# Patient Record
Sex: Male | Born: 1945 | Race: White | Hispanic: No | Marital: Married | Smoking: Current every day smoker
Health system: Southern US, Community
[De-identification: ages and names within clinical notes are randomized; demographics above are authoritative.]

## PROBLEM LIST (undated history)

## (undated) DIAGNOSIS — I25810 Atherosclerosis of coronary artery bypass graft(s) without angina pectoris: Secondary | ICD-10-CM

## (undated) DIAGNOSIS — E7800 Pure hypercholesterolemia, unspecified: Secondary | ICD-10-CM

## (undated) DIAGNOSIS — E669 Obesity, unspecified: Secondary | ICD-10-CM

## (undated) DIAGNOSIS — Z720 Tobacco use: Secondary | ICD-10-CM

## (undated) DIAGNOSIS — E079 Disorder of thyroid, unspecified: Secondary | ICD-10-CM

## (undated) DIAGNOSIS — I10 Essential (primary) hypertension: Secondary | ICD-10-CM

## (undated) DIAGNOSIS — J449 Chronic obstructive pulmonary disease, unspecified: Secondary | ICD-10-CM

## (undated) HISTORY — PX: CORONARY ARTERY BYPASS GRAFT: SHX141

---

## 2004-09-12 ENCOUNTER — Ambulatory Visit: Payer: Self-pay | Admitting: Cardiology

## 2004-09-14 ENCOUNTER — Inpatient Hospital Stay (HOSPITAL_COMMUNITY): Admission: AD | Admit: 2004-09-14 | Discharge: 2004-09-21 | Payer: Self-pay | Admitting: Cardiology

## 2004-09-15 ENCOUNTER — Ambulatory Visit: Payer: Self-pay | Admitting: Cardiology

## 2004-10-25 ENCOUNTER — Encounter
Admission: RE | Admit: 2004-10-25 | Discharge: 2004-10-25 | Payer: Self-pay | Admitting: Thoracic Surgery (Cardiothoracic Vascular Surgery)

## 2004-11-02 ENCOUNTER — Ambulatory Visit: Payer: Self-pay | Admitting: Cardiology

## 2004-11-08 ENCOUNTER — Encounter
Admission: RE | Admit: 2004-11-08 | Discharge: 2004-11-08 | Payer: Self-pay | Admitting: Thoracic Surgery (Cardiothoracic Vascular Surgery)

## 2005-03-17 ENCOUNTER — Ambulatory Visit: Payer: Self-pay | Admitting: Family Medicine

## 2005-04-03 ENCOUNTER — Ambulatory Visit: Payer: Self-pay | Admitting: Family Medicine

## 2005-06-01 ENCOUNTER — Ambulatory Visit: Payer: Self-pay | Admitting: Family Medicine

## 2005-07-20 ENCOUNTER — Ambulatory Visit: Payer: Self-pay | Admitting: Family Medicine

## 2005-10-11 ENCOUNTER — Ambulatory Visit: Payer: Self-pay | Admitting: Family Medicine

## 2012-09-19 DIAGNOSIS — Z7982 Long term (current) use of aspirin: Secondary | ICD-10-CM

## 2012-09-19 DIAGNOSIS — Z602 Problems related to living alone: Secondary | ICD-10-CM

## 2012-09-19 DIAGNOSIS — F17200 Nicotine dependence, unspecified, uncomplicated: Secondary | ICD-10-CM | POA: Diagnosis present

## 2012-09-19 DIAGNOSIS — G458 Other transient cerebral ischemic attacks and related syndromes: Principal | ICD-10-CM | POA: Diagnosis present

## 2012-09-19 DIAGNOSIS — I10 Essential (primary) hypertension: Secondary | ICD-10-CM | POA: Diagnosis present

## 2012-09-19 DIAGNOSIS — R29898 Other symptoms and signs involving the musculoskeletal system: Secondary | ICD-10-CM | POA: Diagnosis present

## 2012-09-19 DIAGNOSIS — I2510 Atherosclerotic heart disease of native coronary artery without angina pectoris: Secondary | ICD-10-CM | POA: Diagnosis present

## 2012-09-19 DIAGNOSIS — Z951 Presence of aortocoronary bypass graft: Secondary | ICD-10-CM

## 2012-09-19 DIAGNOSIS — Z79899 Other long term (current) drug therapy: Secondary | ICD-10-CM

## 2012-09-19 DIAGNOSIS — I440 Atrioventricular block, first degree: Secondary | ICD-10-CM | POA: Diagnosis present

## 2012-09-19 MED ORDER — ONDANSETRON HCL 4 MG/2ML IJ SOLN
4.0000 mg | Freq: Once | INTRAMUSCULAR | Status: AC
  Administered 2012-09-20: 4 mg via INTRAVENOUS
  Filled 2012-09-19: qty 2

## 2012-09-19 MED ORDER — FENTANYL CITRATE 0.05 MG/ML IJ SOLN
50.0000 ug | Freq: Once | INTRAMUSCULAR | Status: AC
  Administered 2012-09-20: 50 ug via INTRAVENOUS
  Filled 2012-09-19: qty 2

## 2012-09-19 MED ORDER — LABETALOL HCL 5 MG/ML IV SOLN: 20.0000 mg | Freq: Once | INTRAVENOUS | Status: DC

## 2012-09-19 NOTE — ED Notes (Signed)
Per EMS: Pt from home c/o HTN and HA. Pt states symptoms have been present for several days. States he has been prescribed medication for HTN but has not taken these medication in about 4 years. Last V/S: BP205/76 Pulse 80. Stroke screen negative. Alertx4, NAD.

## 2012-09-19 NOTE — ED Provider Notes (Signed)
History    CSN: [id number] Arrival date & time 09/19/12  2339  First MD Initiated Contact with Patient 09/19/12 2356     Chief Complaint  Patient presents with  . Headache   (Consider location/radiation/quality/duration/timing/severity/associated sxs/prior Treatment) HPI HX per PT  - HA for the last few days, gradual onset, bitemporal throbbing, worse tonight, called EMS and found to be hypertensive.  He admits to noncompliance with HTN medications for some time now. He has some associated R arm tingling since yesterday, he denies any weakness, no trouble with gait or speech or vision.  HA moderate to severe. No neck pain, rash or fevers.   No past medical history on file. No past surgical history on file. No family history on file. History  Substance Use Topics  . Smoking status: Not on file  . Smokeless tobacco: Not on file  . Alcohol Use: Not on file    Review of Systems  Constitutional: Negative for fever and chills.  HENT: Negative for neck pain and neck stiffness.   Eyes: Negative for pain.  Respiratory: Negative for shortness of breath.   Cardiovascular: Negative for chest pain.  Gastrointestinal: Negative for abdominal pain.  Genitourinary: Negative for dysuria.  Musculoskeletal: Negative for back pain.  Skin: Negative for rash.  Neurological: Positive for numbness and headaches.  All other systems reviewed and are negative.    Allergies  Review of patient's allergies indicates no known allergies.  Home Medications  No current outpatient prescriptions on file. BP 215/98  Pulse 78  Temp(Src) 98.1 F (36.7 C) (Oral)  Resp 20  SpO2 98% Physical Exam  Constitutional: He is oriented to person, place, and time. He appears well-developed and well-nourished.  HENT:  Head: Normocephalic and atraumatic.  Eyes: Conjunctivae and EOM are normal. Pupils are equal, round, and reactive to light.  Neck: Full passive range of motion without pain. Neck supple. No  thyromegaly present.  No meningismus  Cardiovascular: Normal rate, regular rhythm, S1 normal, S2 normal and intact distal pulses.   Pulmonary/Chest: Effort normal and breath sounds normal.  Abdominal: Soft. Bowel sounds are normal. There is no tenderness. There is no CVA tenderness.  Musculoskeletal: Normal range of motion. He exhibits no edema and no tenderness.  Neurological: He is alert and oriented to person, place, and time. He has normal strength and normal reflexes. No cranial nerve deficit or sensory deficit. He displays a negative Romberg sign. Coordination normal. GCS eye subscore is 4. GCS verbal subscore is 5. GCS motor subscore is 6.  Sub dec sensorium to light touch RUE, no motor deficits with equal grips/ biceps/ triceps and dorsi/ plantar flexion  Skin: Skin is warm and dry. No rash noted. No cyanosis. Nails show no clubbing.  Psychiatric: He has a normal mood and affect. His speech is normal and behavior is normal.    ED Course  Procedures (including critical care time)  Results for orders placed during the hospital encounter of 09/20/12  TROPONIN I      Result Value Range   Troponin I <0.30  <0.30 ng/mL  CBC      Result Value Range   WBC 7.0  4.0 - 10.5 K/uL   RBC 5.16  4.22 - 5.81 MIL/uL   Hemoglobin 14.9  13.0 - 17.0 g/dL   HCT 78.2  95.6 - 21.3 %   MCV 83.1  78.0 - 100.0 fL   MCH 28.9  26.0 - 34.0 pg   MCHC 34.7  30.0 - 36.0  g/dL   RDW 16.1  09.6 - 04.5 %   Platelets 105 (*) 150 - 400 K/uL  COMPREHENSIVE METABOLIC PANEL      Result Value Range   Sodium 138  135 - 145 mEq/L   Potassium 3.7  3.5 - 5.1 mEq/L   Chloride 103  96 - 112 mEq/L   CO2 25  19 - 32 mEq/L   Glucose, Bld 129 (*) 70 - 99 mg/dL   BUN 10  6 - 23 mg/dL   Creatinine, Ser 4.09  0.50 - 1.35 mg/dL   Calcium 8.5  8.4 - 81.1 mg/dL   Total Protein 6.7  6.0 - 8.3 g/dL   Albumin 3.5  3.5 - 5.2 g/dL   AST 51 (*) 0 - 37 U/L   ALT 47  0 - 53 U/L   Alkaline Phosphatase 90  39 - 117 U/L   Total  Bilirubin 0.4  0.3 - 1.2 mg/dL   GFR calc non Af Amer 82 (*) >90 mL/min   GFR calc Af Amer >90  >90 mL/min  POCT I-STAT, CHEM 8      Result Value Range   Sodium 141  135 - 145 mEq/L   Potassium 3.6  3.5 - 5.1 mEq/L   Chloride 103  96 - 112 mEq/L   BUN 9  6 - 23 mg/dL   Creatinine, Ser 9.14  0.50 - 1.35 mg/dL   Glucose, Bld 782 (*) 70 - 99 mg/dL   Calcium, Ion 9.56 (*) 1.13 - 1.30 mmol/L   TCO2 24  0 - 100 mmol/L   Hemoglobin 15.3  13.0 - 17.0 g/dL   HCT 21.3  08.6 - 57.8 %   Ct Head Wo Contrast  09/20/2012   *RADIOLOGY REPORT*  Clinical Data: Headaches.  Elevated blood pressure.  Symptoms for several days.  CT HEAD WITHOUT CONTRAST  Technique:  Contiguous axial images were obtained from the base of the skull through the vertex without contrast.  Comparison: None.  Findings: Mild cerebral atrophy.  No ventricular dilatation.  Low attenuation changes in the deep white matter consistent with central small vessel ischemia.  No mass effect or midline shift. No abnormal extra-axial fluid collections.  Gray-white matter junctions are distinct.  Basal cisterns are not effaced.  No evidence of acute intracranial hemorrhage.  No depressed skull fractures.  Visualized paranasal sinuses and mastoid air cells are not opacified.  Probable old fracture deformity of the lateral right maxillary antral wall.  Vascular calcifications.  IMPRESSION: No acute intracranial abnormalities.   Original Report Authenticated By: Burman Nieves, M.D.       Date: 09/19/2012  Rate: 72  Rhythm: normal sinus rhythm  QRS Axis: left  Intervals: PR prolonged  ST/T Wave abnormalities: nonspecific ST/T changes and ST depressions laterally  Conduction Disutrbances:first-degree A-V block   Narrative Interpretation:   Old EKG Reviewed: none available  Recheck after pain medications, headache improving with right upper extremity numbness resolved  2:05 AM d/w MED teaching service - will admit TIA work up  MDM  Headache  with severe hypertension and right arm numbness  Evaluated with EKG, labs and imaging reviewed as above.  IV narcotics, held labetalol for improving blood pressure and concern for TIA  Medical admission    Sunnie Nielsen, MD 09/21/12 [phone]

## 2012-09-20 ENCOUNTER — Emergency Department (HOSPITAL_COMMUNITY): Payer: Medicare Other

## 2012-09-20 ENCOUNTER — Inpatient Hospital Stay (HOSPITAL_COMMUNITY)
Admission: EM | Admit: 2012-09-20 | Discharge: 2012-09-21 | DRG: 069 | Disposition: A | Discharge status: Home / Self Care | Payer: Medicare Other | Attending: Internal Medicine | Admitting: Internal Medicine

## 2012-09-20 ENCOUNTER — Encounter (HOSPITAL_COMMUNITY): Payer: Self-pay | Admitting: Radiology

## 2012-09-20 ENCOUNTER — Inpatient Hospital Stay (HOSPITAL_COMMUNITY): Payer: Medicare Other

## 2012-09-20 DIAGNOSIS — R42 Dizziness and giddiness: Secondary | ICD-10-CM

## 2012-09-20 DIAGNOSIS — M6281 Muscle weakness (generalized): Secondary | ICD-10-CM

## 2012-09-20 DIAGNOSIS — I160 Hypertensive urgency: Secondary | ICD-10-CM

## 2012-09-20 DIAGNOSIS — I10 Essential (primary) hypertension: Secondary | ICD-10-CM | POA: Diagnosis present

## 2012-09-20 DIAGNOSIS — I517 Cardiomegaly: Secondary | ICD-10-CM

## 2012-09-20 DIAGNOSIS — G459 Transient cerebral ischemic attack, unspecified: Secondary | ICD-10-CM | POA: Diagnosis present

## 2012-09-20 DIAGNOSIS — R531 Weakness: Secondary | ICD-10-CM

## 2012-09-20 DIAGNOSIS — Z951 Presence of aortocoronary bypass graft: Secondary | ICD-10-CM

## 2012-09-20 HISTORY — DX: Atherosclerosis of coronary artery bypass graft(s) without angina pectoris: I25.810

## 2012-09-20 HISTORY — DX: Essential (primary) hypertension: I10

## 2012-09-20 LAB — CBC
HCT: 42.9 % (ref 39.0–52.0)
HCT: 44.3 % (ref 39.0–52.0)
Hemoglobin: 14.9 g/dL (ref 13.0–17.0)
Hemoglobin: 15.2 g/dL (ref 13.0–17.0)
MCH: 28.6 pg (ref 26.0–34.0)
MCHC: 34.3 g/dL (ref 30.0–36.0)
MCV: 83.1 fL (ref 78.0–100.0)
MCV: 83.4 fL (ref 78.0–100.0)
RBC: 5.31 MIL/uL (ref 4.22–5.81)
RDW: 14.3 % (ref 11.5–15.5)
WBC: 7 10*3/uL (ref 4.0–10.5)

## 2012-09-20 LAB — COMPREHENSIVE METABOLIC PANEL
ALT: 47 U/L (ref 0–53)
Albumin: 3.5 g/dL (ref 3.5–5.2)
Alkaline Phosphatase: 90 U/L (ref 39–117)
CO2: 25 mEq/L (ref 19–32)
Creatinine, Ser: 0.97 mg/dL (ref 0.50–1.35)
GFR calc Af Amer: >90 mL/min (ref >90)
GFR calc non Af Amer: 82 mL/min — ABNORMAL LOW (ref >90)
Glucose, Bld: 129 mg/dL — ABNORMAL HIGH (ref 70–99)
Potassium: 3.7 mEq/L (ref 3.5–5.1)
Total Bilirubin: 0.4 mg/dL (ref 0.3–1.2)
Total Protein: 6.7 g/dL (ref 6.0–8.3)

## 2012-09-20 LAB — URINALYSIS, ROUTINE W REFLEX MICROSCOPIC
Glucose, UA: NEGATIVE mg/dL
Ketones, ur: NEGATIVE mg/dL
Leukocytes, UA: NEGATIVE
Protein, ur: NEGATIVE mg/dL

## 2012-09-20 LAB — TROPONIN I: Troponin I: <0.3 ng/mL (ref <0.30)

## 2012-09-20 LAB — POCT I-STAT, CHEM 8
Calcium, Ion: 1.09 mmol/L — ABNORMAL LOW (ref 1.13–1.30)
Creatinine, Ser: 1 mg/dL (ref 0.50–1.35)
HCT: 45 % (ref 39.0–52.0)

## 2012-09-20 MED ORDER — ASPIRIN 325 MG PO TABS
325.0000 mg | ORAL_TABLET | Freq: Every day | ORAL | Status: DC
  Administered 2012-09-20 – 2012-09-21 (×2): 325 mg via ORAL
  Filled 2012-09-20: qty 1

## 2012-09-20 MED ORDER — HEPARIN SODIUM (PORCINE) 5000 UNIT/ML IJ SOLN
5000.0000 [IU] | Freq: Three times a day (TID) | INTRAMUSCULAR | Status: DC
  Administered 2012-09-20 – 2012-09-21 (×3): 5000 [IU] via SUBCUTANEOUS
  Filled 2012-09-20 (×7): qty 1

## 2012-09-20 MED ORDER — SENNOSIDES-DOCUSATE SODIUM 8.6-50 MG PO TABS: 1.0000 | ORAL_TABLET | Freq: Every evening | ORAL | Status: DC | PRN

## 2012-09-20 MED ORDER — ACETAMINOPHEN 325 MG PO TABS
650.0000 mg | ORAL_TABLET | Freq: Four times a day (QID) | ORAL | Status: DC | PRN
  Administered 2012-09-20: 650 mg via ORAL
  Filled 2012-09-20: qty 2

## 2012-09-20 MED ORDER — ASPIRIN 300 MG RE SUPP
300.0000 mg | Freq: Every day | RECTAL | Status: DC
  Filled 2012-09-20 (×2): qty 1

## 2012-09-20 NOTE — ED Notes (Signed)
Report given to floor nurse and pt transported to the floor with tech and stretcher. Pt alert and in NAD at time of admission

## 2012-09-20 NOTE — ED Notes (Signed)
Pt taken to ct with tech

## 2012-09-20 NOTE — H&P (Signed)
I saw and evaluated the patient. I reviewed the resident's note and confirmed the resident's findings.  I agree with the assessment and plan as documented in the resident's note.  Briefly,  Randy Carpenter is a 67 yo man with a history of CAD s/p CABG, HTN, and tobacco abuse who presents with 2 days of intermittent right sided weakness and dizziness.He called EMS when the symptoms persisted and was found to have systolic blood pressures > 200.  He was brought to the ED and admitted to the Internal Medicine Teaching Service to rule out stroke. Since admission his dizziness has resolved and he has had no more intermittent right sided weakness.  His strength is 5/5 bilaterally throughout.  MRI/MRA was only notable for small vessel disease with no acute CVA.  Carotid dopplers failed to demonstrate significant carotid stenosis.  The Echo is pending.  The cause of his symptoms remains unclear, although the hypertension could have contributed to the dizziness.  We will reinitiate antihypertensive therapy with medications on the $4 list.  If he does well on telemetry overnight I anticipate he will be ready for discharge home in the AM.  He will need to reestablish with his PCP for follow-up.

## 2012-09-20 NOTE — Progress Notes (Signed)
UR COMPLETED Patient has Medicare and Medicaid with prescription drug coverage

## 2012-09-20 NOTE — H&P (Signed)
Date: 09/20/2012               Patient Name:  Randy Carpenter MRN: [id number]  DOB: 1945/07/05 Age / Sex: 67 y.o., male   PCP: No primary provider on file.         Medical Service: Internal Medicine Teaching Service         Attending Physician: Dr. Sunnie Nielsen, MD    First Contact: Dr. Rocco Serene Pager: [phone]  Second Contact: Dr. Janalyn Harder Pager: [phone]       After Hours (After 5p/  First Contact Pager: [phone]  weekends / holidays): Second Contact Pager: [phone]   Chief Complaint: dizziness and right-sided weakness x 2 days  History of Present Illness: Randy Carpenter is a 67 year old male with a PMH of HTN, CAD s/p CABG (6 years ago) presenting for a 2 day history of dizziness and right-sided weakness x 2 days.  He says he experienced dizziness and difficulty walking (as if his "right leg and arm didn't want to work")  on the night of July 9 but felt better when he woke up on July 10.  When he took a shower that morning he began to feel dizzy again.  He felt this way all day and tried to lay down but reports feeling as if his "head was swelling up and going down" so he called EMS.  His BP was reported to be in the 200s by EMS.  Randy Carpenter has been experiencing chronic headache and R sided CP.  He denies blurry vision.  He is SOB at baseline and reports no change.  He does report 2 pillow orthopnea and has occasional lower extremity edema.  He denies sick contacts.  He lives alone in an independent living facility.  Randy Carpenter has not take his anti-hypertensive medications in 3 years and says he could not afford to go to the doctor anymore.   Meds: Current Facility-Administered Medications  Medication Dose Route Frequency Provider Last Rate Last Dose  . labetalol (NORMODYNE,TRANDATE) injection 20 mg  20 mg Intravenous Once Sunnie Nielsen, MD       No current outpatient prescriptions on file.    Allergies: Allergies as of 09/19/2012  . (No Known Allergies)   History reviewed. No  pertinent past medical history. No past surgical history on file. No family history on file. History   Social History  . Marital Status: Married    Spouse Name: N/A    Number of Children: N/A  . Years of Education: N/A   Occupational History  . Not on file.   Social History Main Topics  . Smoking status: Not on file  . Smokeless tobacco: Not on file  . Alcohol Use: Not on file  . Drug Use: Not on file  . Sexually Active: Not on file   Other Topics Concern  . Not on file   Social History Narrative  . No narrative on file    Review of Systems: Pertinent items are noted in HPI.  Physical Exam: Blood pressure 178/90, pulse 72, temperature 98.1 F (36.7 C), temperature source Oral, resp. rate 19, SpO2 96.00%. General: resting in bed in NAD HEENT: + miosis, EOMI, mucous membranes moist Cardiac: RRR, no rubs, murmurs or gallops, no carotid bruits Pulm: clear to auscultation bilaterally, moving normal volumes of air Abd: soft, nontender, nondistended, BS present Ext: no lower extremity edema  Neuro: alert and oriented X3, mentating appropriately, no facial asymmetry/dysarthria, decreased muscle strength RU and  RL extremity (4/5), LUE and LLE muscle strenght 5/5   Lab results: Basic Metabolic Panel:  Recent Labs  78/46/96 0028 09/20/12 0043  NA 138 141  K 3.7 3.6  CL 103 103  CO2 25  --   GLUCOSE 129* 119*  BUN 10 9  CREATININE 0.97 1.00  CALCIUM 8.5  --    Liver Function Tests:  Recent Labs  09/20/12 0028  AST 51*  ALT 47  ALKPHOS 90  BILITOT 0.4  PROT 6.7  ALBUMIN 3.5   No results found for this basename: LIPASE, AMYLASE,  in the last 72 hours No results found for this basename: AMMONIA,  in the last 72 hours CBC:  Recent Labs  09/20/12 0028 09/20/12 0043  WBC 7.0  --   HGB 14.9 15.3  HCT 42.9 45.0  MCV 83.1  --   PLT 105*  --    Cardiac Enzymes:  Recent Labs  09/20/12 0028  TROPONINI <0.30   BNP: No results found for this  basename: PROBNP,  in the last 72 hours D-Dimer: No results found for this basename: DDIMER,  in the last 72 hours CBG: No results found for this basename: GLUCAP,  in the last 72 hours Hemoglobin A1C: No results found for this basename: HGBA1C,  in the last 72 hours Fasting Lipid Panel: No results found for this basename: CHOL, HDL, LDLCALC, TRIG, CHOLHDL, LDLDIRECT,  in the last 72 hours Thyroid Function Tests: No results found for this basename: TSH, T4TOTAL, FREET4, T3FREE, THYROIDAB,  in the last 72 hours Anemia Panel: No results found for this basename: VITAMINB12, FOLATE, FERRITIN, TIBC, IRON, RETICCTPCT,  in the last 72 hours Coagulation: No results found for this basename: LABPROT, INR,  in the last 72 hours Urine Drug Screen: Drugs of Abuse  No results found for this basename: labopia, cocainscrnur, labbenz, amphetmu, thcu, labbarb    Alcohol Level: No results found for this basename: ETH,  in the last 72 hours Urinalysis: No results found for this basename: COLORURINE, APPERANCEUR, LABSPEC, PHURINE, GLUCOSEU, HGBUR, BILIRUBINUR, KETONESUR, PROTEINUR, UROBILINOGEN, NITRITE, LEUKOCYTESUR,  in the last 72 hours   Imaging results:  Ct Head Wo Contrast  09/20/2012   *RADIOLOGY REPORT*  Clinical Data: Headaches.  Elevated blood pressure.  Symptoms for several days.  CT HEAD WITHOUT CONTRAST  Technique:  Contiguous axial images were obtained from the base of the skull through the vertex without contrast.  Comparison: None.  Findings: Mild cerebral atrophy.  No ventricular dilatation.  Low attenuation changes in the deep white matter consistent with central small vessel ischemia.  No mass effect or midline shift. No abnormal extra-axial fluid collections.  Gray-white matter junctions are distinct.  Basal cisterns are not effaced.  No evidence of acute intracranial hemorrhage.  No depressed skull fractures.  Visualized paranasal sinuses and mastoid air cells are not opacified.  Probable  old fracture deformity of the lateral right maxillary antral wall.  Vascular calcifications.  IMPRESSION: No acute intracranial abnormalities.   Original Report Authenticated By: Burman Nieves, M.D.    Other results: EKG:  Sinus rhythm, first degree AV block, nonspecific intraventricular conduction delay, minimal ST depression lateral leads, baseline wander in leads V1.  Assessment & Plan by Problem:  Randy Carpenter is a 67 yo male with a PMH of HTN and CAD s/p CABG (6 years ago) presenting with headache and right-sided weakness x 2 days.    1. TIA - Pt with neurological deficits on exam x 2 days.  CT  negative for hemorrhage but patient outside of tPA window.  Ischemic stroke cannot be ruled out until MRI completed to evaluate for evidence of infarction.  ABCD2 score 6 (2 day stroke risk 8%, 90 day stroke risk 17.8%) - Telemetry - ASA - MRI - MRA - Echo - Carotid dopplers - AM EKG  2. Hypertension - Pt without hypertensive medications for past 3 years.  - will hold hypertensive medications for now in the setting of possible ischemic stroke   Dispo: Disposition is deferred at this time, awaiting improvement of current medical problems. Anticipated discharge in approximately 3-4 day(s).   The patient does not have a current PCP (No primary provider on file.) and does need an [facility] hospital follow-up appointment after discharge.  The patient does have transportation limitations that hinder transportation to clinic appointments.  Signed: Evelena Peat, DO 09/20/2012, 2:41 AM

## 2012-09-20 NOTE — ED Notes (Signed)
Pt resting comfortably at this time.

## 2012-09-20 NOTE — Progress Notes (Signed)
*  PRELIMINARY RESULTS* Vascular Ultrasound Carotid Duplex (Doppler) has been completed. There is no obvious evidence of hemodynamically significant internal carotid artery stenosis >40%. Vertebral arteries are patent with antegrade flow.  09/20/2012 12:46 PM Gertie Fey, RVT, RDCS, RDMS

## 2012-09-21 ENCOUNTER — Inpatient Hospital Stay (HOSPITAL_COMMUNITY): Payer: Medicare Other

## 2012-09-21 LAB — LIPID PANEL
Cholesterol: 189 mg/dL (ref 0–200)
HDL: 19 mg/dL — ABNORMAL LOW (ref >39)
Total CHOL/HDL Ratio: 9.9 RATIO
Triglycerides: 313 mg/dL — ABNORMAL HIGH (ref <150)

## 2012-09-21 LAB — HEMOGLOBIN A1C
Hgb A1c MFr Bld: 5.9 % — ABNORMAL HIGH (ref <5.7)
Mean Plasma Glucose: 123 mg/dL — ABNORMAL HIGH (ref <117)

## 2012-09-21 MED ORDER — ATORVASTATIN CALCIUM 40 MG PO TABS: 40.0000 mg | ORAL_TABLET | Freq: Every day | ORAL | Status: DC

## 2012-09-21 MED ORDER — ASPIRIN 81 MG PO TABS: 81.0000 mg | ORAL_TABLET | Freq: Every day | ORAL | Status: DC

## 2012-09-21 MED ORDER — METOPROLOL TARTRATE 50 MG PO TABS: 50.0000 mg | ORAL_TABLET | Freq: Two times a day (BID) | ORAL | Status: DC

## 2012-09-21 MED ORDER — PRAVASTATIN SODIUM 40 MG PO TABS: 40.0000 mg | ORAL_TABLET | Freq: Every day | ORAL | Status: DC

## 2012-09-21 MED ORDER — METOPROLOL TARTRATE 50 MG PO TABS
50.0000 mg | ORAL_TABLET | Freq: Two times a day (BID) | ORAL | Status: DC
  Administered 2012-09-21: 50 mg via ORAL
  Filled 2012-09-21 (×2): qty 1

## 2012-09-21 NOTE — Progress Notes (Signed)
   CARE MANAGEMENT NOTE 09/21/2012  Patient:  Randy Carpenter, Randy Carpenter   Account Number:  [id number]  Date Initiated:  09/20/2012  Documentation initiated by:  Jiles Crocker  Subjective/Objective Assessment:   ADMITTED WITH TIA     Action/Plan:   PATIENT RESIDES IN AN INDEPENDENT LIVING FACILITY- has private insurance with Medicare/ Medicaid; CM following for DCP   Anticipated DC Date:  09/27/2012   Anticipated DC Plan:  HOME/SELF CARE      DC Planning Services  CM consult      Choice offered to / List presented to:             Status of service:  Completed, signed off Medicare Important Message given?  NA - LOS <3 / Initial given by admissions (If response is "NO", the following Medicare IM given date fields will be blank) Date Medicare IM given:   Date Additional Medicare IM given:    Discharge Disposition:  HOME/SELF CARE  Per UR Regulation:  Reviewed for med. necessity/level of care/duration of stay  If discussed at Long Length of Stay Meetings, dates discussed:    Comments:  09/21/2012 1500 NCM spoke to pt and he has his Medicaid letter but no number. Faxed facesheet to [facility] with Medicaid info. NCM explained to pt to follow up with Case Worker for [employer] [employer] card. Unable to assist with medication copay or cost. Pt has drug coverage. Contacted [facility] and his pravastatin is $15 without Medicaid info. Isidoro Donning RN CCM Case Mgmt phone [phone]  09/20/2012- Abelino Derrick RN, Shari Prows

## 2012-09-21 NOTE — Progress Notes (Signed)
Occupational Therapy Evaluation and Discharge Patient Details Name: Randy Carpenter MRN: [id number] DOB: 30-May-1945 Today's Date: 09/21/2012 Time: 4098-1191 OT Time Calculation (min): 16 min  OT Assessment / Plan / Recommendation History of present illness Patient is a 67 yo male admitted with Rt sided weakness and dizziness.    Clinical Impression   PTA pt was independent with mobility and ADL. Pt performed sink level ADL and mobility at baseline. Pt does not need further OT services. OT to sign off.    OT Assessment  Patient does not need any further OT services    Follow Up Recommendations  No OT follow up       Equipment Recommendations  None recommended by OT          Precautions / Restrictions Precautions Precautions: Fall Precaution Comments: Patient reports intermittent periods of RLE weakness/buckling at home. Restrictions Weight Bearing Restrictions: No   Pertinent Vitals/Pain Pt reported no pain during session.    ADL  Eating/Feeding: Independent Where Assessed - Eating/Feeding: Chair Grooming: Wash/dry hands;Wash/dry face;Teeth care;Independent Where Assessed - Grooming: Unsupported standing Upper Body Bathing: Modified independent Where Assessed - Upper Body Bathing: Unsupported standing Lower Body Bathing: Modified independent Where Assessed - Lower Body Bathing: Unsupported sit to stand Upper Body Dressing: Modified independent Where Assessed - Upper Body Dressing: Unsupported standing Lower Body Dressing: Modified independent Where Assessed - Lower Body Dressing: Unsupported sit to stand Toilet Transfer: Modified independent Toilet Transfer Method: Sit to Barista: Comfort height toilet Toileting - Clothing Manipulation and Hygiene: Modified independent Where Assessed - Engineer, mining and Hygiene: Standing Tub/Shower Transfer: Modified independent Tub/Shower Transfer Method: Ambulating Equipment Used: Gait  belt Transfers/Ambulation Related to ADLs: Pt modified independent with all transfers and ambulation ADL Comments: Pt modified independent for sink level ADL, and eating. No deficits noted.     OT Goals(Current goals can be found in the care plan section) Acute Rehab OT Goals Patient Stated Goal: To figure out what was wrong.  Visit Information  Last OT Received On: 09/21/12 Assistance Needed: +1 History of Present Illness: Patient is a 67 yo male admitted with Rt sided weakness and dizziness.        Prior Functioning     Home Living Family/patient expects to be discharged to:: Private residence Living Arrangements: Alone Available Help at Discharge:  (None) Type of Home: Apartment Home Access: Level entry Home Layout: One level Home Equipment: None Prior Function Level of Independence: Independent Communication Communication: No difficulties Dominant Hand: Right         Vision/Perception Vision - History Baseline Vision: No visual deficits Patient Visual Report: No change from baseline Vision - Assessment Eye Alignment: Within Functional Limits   Cognition  Cognition Arousal/Alertness: Awake/alert Behavior During Therapy: WFL for tasks assessed/performed Overall Cognitive Status: Within Functional Limits for tasks assessed    Extremity/Trunk Assessment Upper Extremity Assessment Upper Extremity Assessment: Overall WFL for tasks assessed Lower Extremity Assessment Lower Extremity Assessment: Defer to PT evaluation Cervical / Trunk Assessment Cervical / Trunk Assessment: Normal     Mobility Bed Mobility Bed Mobility: Supine to Sit;Sit to Supine Supine to Sit: 7: Independent Sit to Supine: 7: Independent Transfers Transfers: Sit to Stand;Stand to Sit Sit to Stand: 6: Modified independent (Device/Increase time) Stand to Sit: 6: Modified independent (Device/Increase time) Details for Transfer Assistance: modified independent for transfers         Balance Balance Balance Assessed: Yes Standardized Balance Assessment Standardized Balance Assessment: Dynamic Gait Index Dynamic  Gait Index Level Surface: Normal Change in Gait Speed: Normal Gait with Horizontal Head Turns: Normal Gait with Vertical Head Turns: Normal Gait and Pivot Turn: Normal Step Over Obstacle: Normal Step Around Obstacles: Normal Steps: Normal Total Score: 24   End of Session OT - End of Session Equipment Utilized During Treatment: Gait belt Activity Tolerance: Patient tolerated treatment well Patient left: in chair;with call bell/phone within reach Nurse Communication: Mobility status  GO     Sherryl Manges 09/21/2012, 11:55 AM

## 2012-09-21 NOTE — Progress Notes (Signed)
I agree with the following treatment note after reviewing documentation.   Johnston, Nadean Montanaro Brynn   OTR/L Pager: [phone] Office: [phone] .   

## 2012-09-21 NOTE — Progress Notes (Signed)
Subjective: No acute events overnight.  MRI shows no evidence of stroke, and carotid dopplers and Echo unremarkable.  The patient notes no further extremity weakness or dizziness.  Of note, CXR overnight showed a well-circumscribed lesion on the lateral view, most likely artifact from an item perhaps on the patient's clothing.  Will repeat CXR PA and lateral today to confirm.  Objective: Vital signs in last 24 hours: Filed Vitals:   09/20/12 2148 09/21/12 0216 09/21/12 0547 09/21/12 1037  BP: 160/84 130/65 145/77 151/78  Pulse: 64 63 59 67  Temp: 98.3 F (36.8 C) 98 F (36.7 C) 98.1 F (36.7 C) 97.9 F (36.6 C)  TempSrc: Oral Oral Oral Oral  Resp: 18 20 20 20   Height:      Weight:      SpO2: 96% 94% 95% 95%   Weight change:   Intake/Output Summary (Last 24 hours) at 09/21/12 1142 Last data filed at 09/21/12 0600  Gross per 24 hour  Intake   1160 ml  Output    700 ml  Net    460 ml   General: alert, cooperative, and in no apparent distress HEENT: pupils equal round and reactive to light, vision grossly intact, oropharynx clear and non-erythematous  Neck: supple, no lymphadenopathy Lungs: clear to ascultation bilaterally, normal work of respiration, no wheezes, rales, ronchi Heart: regular rate and rhythm, no murmurs, gallops, or rubs Abdomen: soft, non-tender, non-distended, normal bowel sounds Extremities: no cyanosis, clubbing, or edema Neurologic: alert & oriented X3, cranial nerves II-XII intact, strength 5/5 throughout, sensation intact to light touch  Lab Results: Basic Metabolic Panel:  Recent Labs Lab 09/20/12 0028 09/20/12 0043 09/20/12 1020  NA 138 141  --   K 3.7 3.6  --   CL 103 103  --   CO2 25  --   --   GLUCOSE 129* 119*  --   BUN 10 9  --   CREATININE 0.97 1.00 1.13  CALCIUM 8.5  --   --    Liver Function Tests:  Recent Labs Lab 09/20/12 0028  AST 51*  ALT 47  ALKPHOS 90  BILITOT 0.4  PROT 6.7  ALBUMIN 3.5   CBC:  Recent  Labs Lab 09/20/12 0028 09/20/12 0043 09/20/12 1020  WBC 7.0  --  5.9  HGB 14.9 15.3 15.2  HCT 42.9 45.0 44.3  MCV 83.1  --  83.4  PLT 105*  --  105*   Cardiac Enzymes:  Recent Labs Lab 09/20/12 0028  TROPONINI <0.30   Fasting Lipid Panel:  Recent Labs Lab 09/21/12 0630  CHOL 189  HDL 19*  LDLCALC 107*  TRIG 313*  CHOLHDL 9.9   Urinalysis:  Recent Labs Lab 09/20/12 0248  COLORURINE YELLOW  LABSPEC 1.018  PHURINE 7.0  GLUCOSEU NEGATIVE  HGBUR NEGATIVE  BILIRUBINUR NEGATIVE  KETONESUR NEGATIVE  PROTEINUR NEGATIVE  UROBILINOGEN 1.0  NITRITE NEGATIVE  LEUKOCYTESUR NEGATIVE    Studies/Results: Dg Chest 2 View  09/20/2012   *RADIOLOGY REPORT*  Clinical Data: Stroke  CHEST - 2 VIEW  Comparison: 11/08/2004  Findings: COPD with pulmonary hyperinflation.  Prior CABG. Negative for heart failure or pneumonia.  Well-circumscribed oval density overlying the heart on the lateral view .  IMPRESSION: COPD.  No acute abnormality.  Ovoid density on the lateral view overlying the heart was not seen previously.  This is well circumscribed and probably benign. Follow-up chest x-ray is suggested.   Original Report Authenticated By: Janeece Riggers, M.D.   Ct Head  Wo Contrast  09/20/2012   *RADIOLOGY REPORT*  Clinical Data: Headaches.  Elevated blood pressure.  Symptoms for several days.  CT HEAD WITHOUT CONTRAST  Technique:  Contiguous axial images were obtained from the base of the skull through the vertex without contrast.  Comparison: None.  Findings: Mild cerebral atrophy.  No ventricular dilatation.  Low attenuation changes in the deep white matter consistent with central small vessel ischemia.  No mass effect or midline shift. No abnormal extra-axial fluid collections.  Gray-white matter junctions are distinct.  Basal cisterns are not effaced.  No evidence of acute intracranial hemorrhage.  No depressed skull fractures.  Visualized paranasal sinuses and mastoid air cells are not  opacified.  Probable old fracture deformity of the lateral right maxillary antral wall.  Vascular calcifications.  IMPRESSION: No acute intracranial abnormalities.   Original Report Authenticated By: Burman Nieves, M.D.   Mr Brain Wo Contrast  09/20/2012   *RADIOLOGY REPORT*  Clinical Data:  Two history of dizziness and right-sided weakness. Difficulty walking.  MRI HEAD WITHOUT CONTRAST MRA HEAD WITHOUT CONTRAST  Technique:  Multiplanar, multiecho pulse sequences of the brain and surrounding structures were obtained without intravenous contrast. Angiographic images of the head were obtained using MRA technique without contrast.  Comparison:  CT of the head without contrast 09/20/2012.  MRI HEAD  Findings:  The diffusion weighted images demonstrate no evidence for acute or subacute infarction.  Mild generalized atrophy is present.  Moderate to confluent periventricular and scattered subcortical T2 hyperintensities are evident bilaterally.  Flow is present in the major intracranial arteries.  The ventricles are proportionate to the degree of atrophy.  No significant extra-axial fluid collection is present.  The globes orbits are intact.  Mild mucosal thickening is present in the anterior ethmoid air cells and maxillary sinuses bilaterally.  No fluid levels are present.  There is some fluid in the right mastoid air cells.  No obstructing nasopharyngeal lesion is evident.  IMPRESSION:  1.  No acute or focal abnormality to explain right-sided weakness. 2.  The atrophy and moderate diffuse white matter disease.  This likely reflects the sequelae of chronic microvascular ischemia, advanced for age. 3.  Mild diffuse sinus disease.  MRA HEAD  Findings: The internal carotid arteries are within normal limits from the high cervical segments through the ICA termini.  The left A1 segment is slightly dominant to the right.  The M1 segments are within normal limits bilaterally.  The anterior communicating artery is patent.   There is segmental attenuation of distal MCA branch vessels bilaterally without significant proximal stenosis or occlusion.  The right vertebral artery is the dominant vessel.  The PICA origins are visualized and normal bilaterally.  The basilar artery is normal.  Both posterior cerebral arteries originate from the basilar tip.  There is some attenuation of distal PCA branch vessels bilaterally.  IMPRESSION:  1.  Mild distal small vessel disease. 2.  No significant proximal stenosis, aneurysm, or branch vessel occlusion.   Original Report Authenticated By: Marin Roberts, M.D.   Mr Mra Head/brain Wo Cm  09/20/2012   *RADIOLOGY REPORT*  Clinical Data:  Two history of dizziness and right-sided weakness. Difficulty walking.  MRI HEAD WITHOUT CONTRAST MRA HEAD WITHOUT CONTRAST  Technique:  Multiplanar, multiecho pulse sequences of the brain and surrounding structures were obtained without intravenous contrast. Angiographic images of the head were obtained using MRA technique without contrast.  Comparison:  CT of the head without contrast 09/20/2012.  MRI HEAD  Findings:  The diffusion weighted images demonstrate no evidence for acute or subacute infarction.  Mild generalized atrophy is present.  Moderate to confluent periventricular and scattered subcortical T2 hyperintensities are evident bilaterally.  Flow is present in the major intracranial arteries.  The ventricles are proportionate to the degree of atrophy.  No significant extra-axial fluid collection is present.  The globes orbits are intact.  Mild mucosal thickening is present in the anterior ethmoid air cells and maxillary sinuses bilaterally.  No fluid levels are present.  There is some fluid in the right mastoid air cells.  No obstructing nasopharyngeal lesion is evident.  IMPRESSION:  1.  No acute or focal abnormality to explain right-sided weakness. 2.  The atrophy and moderate diffuse white matter disease.  This likely reflects the sequelae of  chronic microvascular ischemia, advanced for age. 3.  Mild diffuse sinus disease.  MRA HEAD  Findings: The internal carotid arteries are within normal limits from the high cervical segments through the ICA termini.  The left A1 segment is slightly dominant to the right.  The M1 segments are within normal limits bilaterally.  The anterior communicating artery is patent.  There is segmental attenuation of distal MCA branch vessels bilaterally without significant proximal stenosis or occlusion.  The right vertebral artery is the dominant vessel.  The PICA origins are visualized and normal bilaterally.  The basilar artery is normal.  Both posterior cerebral arteries originate from the basilar tip.  There is some attenuation of distal PCA branch vessels bilaterally.  IMPRESSION:  1.  Mild distal small vessel disease. 2.  No significant proximal stenosis, aneurysm, or branch vessel occlusion.   Original Report Authenticated By: Marin Roberts, M.D.   Medications: I have reviewed the patient's current medications. Scheduled Meds: . aspirin  300 mg Rectal Daily   Or  . aspirin  325 mg Oral Daily  . heparin  5,000 Units Subcutaneous Q8H   Continuous Infusions:  PRN Meds:.acetaminophen, senna-docusate  Assessment/Plan: The patient is a 67 yo man, history of CAD s/p CABG, HTN, tobacco abuse, presenting with right-sided weakness and dizziness which have now resolved.  # TIA - R upper and lower extremity weakness and dizziness have now resolved.  MRI shows no evidence of stroke.  Carotid dopplers and echo show no potential etiology for TIA or stroke.  The patient presented on no medications.  His ASCVD risk is 37.8%.  He has risk factors of HTN, tobacco abuse, as well as his history of CAD.  The patient has medicare and medicaid with prescription drug coverage, so his medications should be $3. -at discharge, start aspirin, statin, metoprolol -The patient follows with the [facility] (Dr.  Lysbeth Galas)  # HTN - untreated due to financial reasons.  Patient has a history of CAD. -will start treatment with metoprolol, given his CAD  # CXR abnormality - well-circumscribed lesion on lateral film of CXR suggests an object external to the patient's chest.  Will repeat a PA and lateral CXR to ensure resolution of the area.  If the lesion persists, patient may need CT for further evaluation. -repeat CXR today, PA and lateral  Dispo:  If CXR shows resolution of lesion, patient will be stable for discharge today.  The patient does have a current PCP (Dr. Lysbeth Galas) and does not need an [facility] hospital follow-up appointment after discharge.    LOS: 1 day   Linward Headland, MD 09/21/2012, 11:42 AM

## 2012-09-21 NOTE — Progress Notes (Signed)
Returned valuables that were locked up to pt. Pt. Verified with this RN that all valuables were accounted for.

## 2012-09-21 NOTE — Progress Notes (Signed)
SLP attempted to see Pt for SLE today, however, Pt was at a medical procedure.

## 2012-09-21 NOTE — Progress Notes (Signed)
Internal Medicine Attending  Date: 09/21/2012  Patient name: Randy Carpenter Medical record number: [id number] Date of birth: [birth year] Age: 67 y.o. Gender: male  I saw and evaluated the patient. I reviewed the resident's note by Dr. Manson Passey and I agree with the resident's findings and plans as documented in his progress note.  Mr. Phariss work-up was negative for TIA or CVA.  Symptoms likely secondary to dehydration and significant hypertension.  Symptoms have resolve with rehydration and management of his hypertension.  He is stable for discharge today with follow-up in his [facility] office.

## 2012-09-21 NOTE — Discharge Summary (Signed)
Name: Randy Carpenter MRN: [id number] DOB: [birth year] 67 y.o. PCP: Dr. Joette Catching  Date of Admission: 09/20/2012 12:25 AM Date of Discharge: 09/21/2012 Attending Physician: Rocco Serene, MD  Discharge Diagnosis: 1. Transient Ischemic Attack - right-sided weakness and dizziness resolved 2. Hypertension - started metoprolol 3. Chest x-ray abnormality - resolved on re-check 4. CAD - s/p CABG  Discharge Medications:   Medication List         aspirin 81 MG tablet  Take 1 tablet (81 mg total) by mouth daily.     metoprolol 50 MG tablet  Commonly known as:  LOPRESSOR  Take 1 tablet (50 mg total) by mouth 2 (two) times daily. For high blood pressure     pravastatin 40 MG tablet  Commonly known as:  PRAVACHOL  Take 1 tablet (40 mg total) by mouth daily.        Disposition and follow-up:   [name] [name] was discharged from [facility] in Stable condition.  At the hospital follow up visit please address:  1.  Please address hypertension, and consider adding an ACE inhibitor (history of CAD)  2.  Labs / imaging needed at time of follow-up: Follow-up final results of carotid dopplers (prelim results wnl)  3.  Pending labs/ test needing follow-up: None  Follow-up Appointments:     Follow-up Information   Follow up with [name], MD. Schedule an appointment as soon as possible for a visit in 2 weeks.   Contact information:   [address] [facility] [address] [phone]       Discharge Instructions: Discharge Orders   Future Orders Complete By Expires     Call MD for:  persistant dizziness or light-headedness  As directed     Call MD for:  temperature >100.4  As directed     Diet - low sodium heart healthy  As directed     Discharge instructions  As directed     Comments:      You were hospitalized for a TIA, or a "mini stroke".  To prevent this from happening again, and to decrease your risk for a future stroke, we are  starting the following medications: 1. Take Atorvastatin, 1 tablet daily, to lower your cholesterol 2. Take Metoprolol, 1 tablet twice per day to lower your blood pressure 3. Take a baby Aspirin, 81 mg, 1 tablet daily as a mild blood thinner    Increase activity slowly  As directed        Consultations: None  Procedures Performed:  Dg Chest 2 View  09/21/2012   *RADIOLOGY REPORT*  Clinical Data: Follow-up chest radiograph.  CHEST - 2 VIEW  Comparison: Two-view chest 09/20/2012.  Findings: The heart to size is normal.  Mild emphysematous changes are again seen.  The density on the lateral image is no longer present and may have been external to the patient.  The visualized soft tissues and bony thorax are unremarkable.  IMPRESSION:  1.  Emphysema. 2.  No acute cardiopulmonary disease. 3.  The previously noted density in the lateral image is no longer present.   Original Report Authenticated By: Marin Roberts, M.D.   Dg Chest 2 View  09/20/2012   *RADIOLOGY REPORT*  Clinical Data: Stroke  CHEST - 2 VIEW  Comparison: 11/08/2004  Findings: COPD with pulmonary hyperinflation.  Prior CABG. Negative for heart failure or pneumonia.  Well-circumscribed oval density overlying the heart on the lateral view .  IMPRESSION: COPD.  No acute  abnormality.  Ovoid density on the lateral view overlying the heart was not seen previously.  This is well circumscribed and probably benign. Follow-up chest x-ray is suggested.   Original Report Authenticated By: Janeece Riggers, M.D.   Ct Head Wo Contrast  09/20/2012   *RADIOLOGY REPORT*  Clinical Data: Headaches.  Elevated blood pressure.  Symptoms for several days.  CT HEAD WITHOUT CONTRAST  Technique:  Contiguous axial images were obtained from the base of the skull through the vertex without contrast.  Comparison: None.  Findings: Mild cerebral atrophy.  No ventricular dilatation.  Low attenuation changes in the deep white matter consistent with central small vessel  ischemia.  No mass effect or midline shift. No abnormal extra-axial fluid collections.  Gray-white matter junctions are distinct.  Basal cisterns are not effaced.  No evidence of acute intracranial hemorrhage.  No depressed skull fractures.  Visualized paranasal sinuses and mastoid air cells are not opacified.  Probable old fracture deformity of the lateral right maxillary antral wall.  Vascular calcifications.  IMPRESSION: No acute intracranial abnormalities.   Original Report Authenticated By: Burman Nieves, M.D.   Mr Brain Wo Contrast  09/20/2012   *RADIOLOGY REPORT*  Clinical Data:  Two history of dizziness and right-sided weakness. Difficulty walking.  MRI HEAD WITHOUT CONTRAST MRA HEAD WITHOUT CONTRAST  Technique:  Multiplanar, multiecho pulse sequences of the brain and surrounding structures were obtained without intravenous contrast. Angiographic images of the head were obtained using MRA technique without contrast.  Comparison:  CT of the head without contrast 09/20/2012.  MRI HEAD  Findings:  The diffusion weighted images demonstrate no evidence for acute or subacute infarction.  Mild generalized atrophy is present.  Moderate to confluent periventricular and scattered subcortical T2 hyperintensities are evident bilaterally.  Flow is present in the major intracranial arteries.  The ventricles are proportionate to the degree of atrophy.  No significant extra-axial fluid collection is present.  The globes orbits are intact.  Mild mucosal thickening is present in the anterior ethmoid air cells and maxillary sinuses bilaterally.  No fluid levels are present.  There is some fluid in the right mastoid air cells.  No obstructing nasopharyngeal lesion is evident.  IMPRESSION:  1.  No acute or focal abnormality to explain right-sided weakness. 2.  The atrophy and moderate diffuse white matter disease.  This likely reflects the sequelae of chronic microvascular ischemia, advanced for age. 3.  Mild diffuse sinus  disease.  MRA HEAD  Findings: The internal carotid arteries are within normal limits from the high cervical segments through the ICA termini.  The left A1 segment is slightly dominant to the right.  The M1 segments are within normal limits bilaterally.  The anterior communicating artery is patent.  There is segmental attenuation of distal MCA branch vessels bilaterally without significant proximal stenosis or occlusion.  The right vertebral artery is the dominant vessel.  The PICA origins are visualized and normal bilaterally.  The basilar artery is normal.  Both posterior cerebral arteries originate from the basilar tip.  There is some attenuation of distal PCA branch vessels bilaterally.  IMPRESSION:  1.  Mild distal small vessel disease. 2.  No significant proximal stenosis, aneurysm, or branch vessel occlusion.   Original Report Authenticated By: Marin Roberts, M.D.   Mr Mra Head/brain Wo Cm  09/20/2012   *RADIOLOGY REPORT*  Clinical Data:  Two history of dizziness and right-sided weakness. Difficulty walking.  MRI HEAD WITHOUT CONTRAST MRA HEAD WITHOUT CONTRAST  Technique:  Multiplanar, multiecho  pulse sequences of the brain and surrounding structures were obtained without intravenous contrast. Angiographic images of the head were obtained using MRA technique without contrast.  Comparison:  CT of the head without contrast 09/20/2012.  MRI HEAD  Findings:  The diffusion weighted images demonstrate no evidence for acute or subacute infarction.  Mild generalized atrophy is present.  Moderate to confluent periventricular and scattered subcortical T2 hyperintensities are evident bilaterally.  Flow is present in the major intracranial arteries.  The ventricles are proportionate to the degree of atrophy.  No significant extra-axial fluid collection is present.  The globes orbits are intact.  Mild mucosal thickening is present in the anterior ethmoid air cells and maxillary sinuses bilaterally.  No fluid  levels are present.  There is some fluid in the right mastoid air cells.  No obstructing nasopharyngeal lesion is evident.  IMPRESSION:  1.  No acute or focal abnormality to explain right-sided weakness. 2.  The atrophy and moderate diffuse white matter disease.  This likely reflects the sequelae of chronic microvascular ischemia, advanced for age. 3.  Mild diffuse sinus disease.  MRA HEAD  Findings: The internal carotid arteries are within normal limits from the high cervical segments through the ICA termini.  The left A1 segment is slightly dominant to the right.  The M1 segments are within normal limits bilaterally.  The anterior communicating artery is patent.  There is segmental attenuation of distal MCA branch vessels bilaterally without significant proximal stenosis or occlusion.  The right vertebral artery is the dominant vessel.  The PICA origins are visualized and normal bilaterally.  The basilar artery is normal.  Both posterior cerebral arteries originate from the basilar tip.  There is some attenuation of distal PCA branch vessels bilaterally.  IMPRESSION:  1.  Mild distal small vessel disease. 2.  No significant proximal stenosis, aneurysm, or branch vessel occlusion.   Original Report Authenticated By: Marin Roberts, M.D.    Admission HPI:  Mr. Manlove is a 67 year old male with a PMH of HTN, CAD s/p CABG (6 years ago) presenting for a 2 day history of dizziness and right-sided weakness. He says he experienced dizziness and difficulty walking (as if his "right leg and arm didn't want to work") on the night of July 9 but felt better when he woke up on July 10. When he took a shower that morning he began to feel dizzy again. He felt this way all day and tried to lay down but reports feeling as if his "head was swelling up and going down" so he called EMS. His BP was reported to be in the 200s by EMS. Mr. Mccowen has been experiencing chronic headache and R sided CP. He denies blurry vision. He  is SOB at baseline and reports no change. He does report 2 pillow orthopnea and has occasional lower extremity edema. He denies sick contacts. He lives alone in an independent living facility. Mr. Bob has not take his anti-hypertensive medications in 3 years and says he could not afford to go to the doctor anymore.  Hospital Course by problem list: 1. Transient Ischemic Attack - The patient presented with symptoms of right upper and lower extremity weakness with dizziness, which resolved during the patient's hospitalization.  CT and MRI showed no evidence of CVA.  Echocardiogram and carotid dopplers (prelim) showed no abnormalities.  PT evaluated the patient, and identified no need for further PT.  The patient presented on no medications, but was discharged on aspirin, atorvastatin, and  metoprolol, for risk factor modification.  The patient has medicare and medicaid with prescription drug coverage.  2. Hypertension - The patient has a history of HTN.  The patient's initial BP was 215/98, though this improved to the SBP 140-150's without intervention (permissive HTN while ruling out CVA).  The patient was not taking any BP medications on admission.  Given his history of CAD, he was started on metoprolol at discharge.  3. Chest x-ray abnormality - Initial CXR showed a well-circumscribed density in the lateral film, thought to be an object external to the patient (ie something in his clothing).  A repeat CXR was performed, which no longer showed this density,   Discharge Vitals:   BP 151/78  Pulse 67  Temp(Src) 97.9 F (36.6 C) (Oral)  Resp 20  Ht 5' 11.5" (1.816 m)  Wt 236 lb 12.8 oz (107.412 kg)  BMI 32.57 kg/m2  SpO2 95%  Discharge Labs:  Results for orders placed during the hospital encounter of 09/20/12 (from the past 24 hour(s))  LIPID PANEL     Status: Abnormal   Collection Time    09/21/12  6:30 AM      Result Value Range   Cholesterol 189  0 - 200 mg/dL   Triglycerides 191 (*)  <150 mg/dL   HDL 19 (*) >47 mg/dL   Total CHOL/HDL Ratio 9.9     VLDL 63 (*) 0 - 40 mg/dL   LDL Cholesterol 829 (*) 0 - 99 mg/dL    Signed: Linward Headland, MD 09/21/2012, 1:30 PM   Time Spent on Discharge: 45 minutes Services Ordered on Discharge: None Equipment Ordered on Discharge: None

## 2012-09-21 NOTE — Evaluation (Signed)
Physical Therapy Evaluation Patient Details Name: Randy Carpenter MRN: [id number] DOB: 1945/11/18 Today's Date: 09/21/2012 Time: 4098-1191 PT Time Calculation (min): 21 min  PT Assessment / Plan / Recommendation History of Present Illness  Patient is a 67 yo male admitted with Rt sided weakness and dizziness.   Clinical Impression  Patient's symptoms have resolved at this time.  Patient is independent with all mobility and gait.  Scored 24/24 on DGI balance assessment.  No acute PT needs identified - PT will sign off.    PT Assessment  Patent does not need any further PT services    Follow Up Recommendations  No PT follow up    Does the patient have the potential to tolerate intense rehabilitation      Barriers to Discharge        Equipment Recommendations  Cane (For prn use at home when RLE buckling occurs)    Recommendations for Other Services     Frequency      Precautions / Restrictions Precautions Precautions: Fall Precaution Comments: Patient reports intermittent periods of RLE weakness/buckling at home. Restrictions Weight Bearing Restrictions: No   Pertinent Vitals/Pain       Mobility  Bed Mobility Bed Mobility: Supine to Sit;Sit to Supine Supine to Sit: 7: Independent Sit to Supine: 7: Independent Transfers Transfers: Sit to Stand;Stand to Sit Sit to Stand: 5: Supervision;From bed Stand to Sit: 5: Supervision;To bed Details for Transfer Assistance: Supervision for safety only.  No assist needed. Ambulation/Gait Ambulation/Gait Assistance: 5: Supervision Ambulation Distance (Feet): 200 Feet Assistive device: None Ambulation/Gait Assistance Details: Patient with good gait pattern, speed and balance. Gait Pattern: Within Functional Limits Gait velocity: WFL Stairs: Yes Stairs Assistance: 5: Supervision Stairs Assistance Details (indicate cue type and reason): Good balance on stairs Stair Management Technique: No rails;Alternating  pattern;Forwards Number of Stairs: 5 Modified Rankin (Stroke Patients Only) Pre-Morbid Rankin Score: No symptoms Modified Rankin: No symptoms        PT Goals(Current goals can be found in the care plan section)    Visit Information  Last PT Received On: 09/21/12 Assistance Needed: +1 History of Present Illness: Patient is a 67 yo male admitted with Rt sided weakness and dizziness.        Prior Functioning  Home Living Family/patient expects to be discharged to:: Private residence Living Arrangements: Alone Available Help at Discharge:  (None) Type of Home: Apartment Home Access: Level entry Home Layout: One level Home Equipment: None Prior Function Level of Independence: Independent Communication Communication: No difficulties    Cognition  Cognition Arousal/Alertness: Awake/alert Behavior During Therapy: WFL for tasks assessed/performed Overall Cognitive Status: Within Functional Limits for tasks assessed    Extremity/Trunk Assessment Upper Extremity Assessment Upper Extremity Assessment: Overall WFL for tasks assessed Lower Extremity Assessment Lower Extremity Assessment: Overall WFL for tasks assessed Cervical / Trunk Assessment Cervical / Trunk Assessment: Normal   Balance Balance Balance Assessed: Yes Standardized Balance Assessment Standardized Balance Assessment: Dynamic Gait Index Dynamic Gait Index Level Surface: Normal Change in Gait Speed: Normal Gait with Horizontal Head Turns: Normal Gait with Vertical Head Turns: Normal Gait and Pivot Turn: Normal Step Over Obstacle: Normal Step Around Obstacles: Normal Steps: Normal Total Score: 24  End of Session PT - End of Session Equipment Utilized During Treatment: Gait belt Activity Tolerance: Patient tolerated treatment well Patient left: in bed;with call bell/phone within reach Nurse Communication: Mobility status  GP     Vena Austria 09/21/2012, 10:04 AM Durenda Hurt. Earlene Plater, PT, MBA  Acute Rehab  Services Pager [phone]

## 2013-02-18 ENCOUNTER — Emergency Department (HOSPITAL_COMMUNITY)
Admission: EM | Admit: 2013-02-18 | Discharge: 2013-02-18 | Disposition: A | Discharge status: Home / Self Care | Payer: Medicare Other | Attending: Emergency Medicine | Admitting: Emergency Medicine

## 2013-02-18 ENCOUNTER — Emergency Department (HOSPITAL_COMMUNITY): Payer: Medicare Other

## 2013-02-18 ENCOUNTER — Encounter (HOSPITAL_COMMUNITY): Payer: Self-pay | Admitting: Emergency Medicine

## 2013-02-18 DIAGNOSIS — R52 Pain, unspecified: Secondary | ICD-10-CM | POA: Insufficient documentation

## 2013-02-18 DIAGNOSIS — M25552 Pain in left hip: Secondary | ICD-10-CM

## 2013-02-18 DIAGNOSIS — I10 Essential (primary) hypertension: Secondary | ICD-10-CM | POA: Insufficient documentation

## 2013-02-18 DIAGNOSIS — F17200 Nicotine dependence, unspecified, uncomplicated: Secondary | ICD-10-CM | POA: Insufficient documentation

## 2013-02-18 DIAGNOSIS — I25810 Atherosclerosis of coronary artery bypass graft(s) without angina pectoris: Secondary | ICD-10-CM | POA: Insufficient documentation

## 2013-02-18 DIAGNOSIS — M25559 Pain in unspecified hip: Secondary | ICD-10-CM | POA: Insufficient documentation

## 2013-02-18 DIAGNOSIS — Z951 Presence of aortocoronary bypass graft: Secondary | ICD-10-CM | POA: Insufficient documentation

## 2013-02-18 DIAGNOSIS — Z79899 Other long term (current) drug therapy: Secondary | ICD-10-CM | POA: Insufficient documentation

## 2013-02-18 DIAGNOSIS — Z7982 Long term (current) use of aspirin: Secondary | ICD-10-CM | POA: Insufficient documentation

## 2013-02-18 MED ORDER — HYDROCODONE-ACETAMINOPHEN 5-325 MG PO TABS
1.0000 | ORAL_TABLET | Freq: Once | ORAL | Status: AC
  Administered 2013-02-18: 1 via ORAL
  Filled 2013-02-18: qty 1

## 2013-02-18 MED ORDER — HYDROCODONE-ACETAMINOPHEN 5-325 MG PO TABS: 1.0000 | ORAL_TABLET | Freq: Four times a day (QID) | ORAL | Status: DC | PRN

## 2013-02-18 MED ORDER — HYDROCODONE-ACETAMINOPHEN 5-325 MG PO TABS
1.0000 | ORAL_TABLET | Freq: Once | ORAL | Status: DC
  Filled 2013-02-18: qty 1

## 2013-02-18 NOTE — ED Notes (Signed)
Bed: WA09 Expected date:  Expected time:  Means of arrival:  Comments: Hip pain 

## 2013-02-18 NOTE — Progress Notes (Signed)
   CARE MANAGEMENT ED NOTE 02/18/2013  Patient:  ARGUS, CARAHER   Account Number:  [id number]  Date Initiated:  02/18/2013  Documentation initiated by:  Radford Pax  Subjective/Objective Assessment:   Patient presenst to Ed with difficulty walking.  Patient reports he "lost control of his leg."     Subjective/Objective Assessment Detail:     Action/Plan:   Action/Plan Detail:   Anticipated DC Date:       Status Recommendation to Physician:   Result of Recommendation:    Other ED Services  Consult Working Plan    DC Planning Services  Other  PCP issues    Choice offered to / List presented to:            Status of service:  Completed, signed off  ED Comments:   ED Comments Detail:  Patient confirms his pcp is Dr. Lysbeth Galas in [place]. System updated.

## 2013-02-18 NOTE — ED Provider Notes (Signed)
CSN: [id number]     Arrival date & time 02/18/13  1449 History   First MD Initiated Contact with Patient 02/18/13 1454     No chief complaint on file.  (Consider location/radiation/quality/duration/timing/severity/associated sxs/prior Treatment) HPI  This is a 67 yo with history of CAD, HTN who presents with left hip pain.  Patient states that he was ambulating at home earlier today when his had acute onset of left hip pain and weakness.  He did not fall.  He denies syncope.  He denies any injury.  THis happened at 9:30 am this morning.  He has been ambulatory at home since.  He took a "pain pill" at noon without improvement.  He denies any weakness, numbness or tingling of the left upper extremity.  Denies any speech difficulty.  Reports TIA 3 months ago.  Denies other symptoms.  Past Medical History  Diagnosis Date  . Hypertension   . CAD (coronary artery disease), autologous vein bypass graft     s/p CABG   Past Surgical History  Procedure Laterality Date  . Coronary artery bypass graft     No family history on file. History  Substance Use Topics  . Smoking status: Current Every Day Smoker -- 0.50 packs/day for 59 years    Types: Cigarettes  . Smokeless tobacco: Not on file  . Alcohol Use: No    Review of Systems  Constitutional: Negative.  Negative for fever.  Respiratory: Negative.  Negative for chest tightness and shortness of breath.   Cardiovascular: Negative.  Negative for chest pain.  Gastrointestinal: Negative.  Negative for abdominal pain.  Genitourinary: Negative.  Negative for dysuria.  Musculoskeletal: Negative for back pain.       Left hip pain  Skin: Negative for rash.  Neurological: Negative for headaches.  All other systems reviewed and are negative.    Allergies  Review of patient's allergies indicates no known allergies.  Home Medications   Current Outpatient Rx  Name  Route  Sig  Dispense  Refill  . amLODipine (NORVASC) 2.5 MG tablet   Oral  Take 2.5 mg by mouth daily.         Marland Kitchen aspirin 81 MG tablet   Oral   Take 1 tablet (81 mg total) by mouth daily.   30 tablet   0   . fenofibrate 160 MG tablet   Oral   Take 160 mg by mouth daily after breakfast.         . HYDROcodone-acetaminophen (NORCO/VICODIN) 5-325 MG per tablet   Oral   Take 1 tablet by mouth every 6 (six) hours as needed for moderate pain (pain).         Marland Kitchen levothyroxine (SYNTHROID, LEVOTHROID) 175 MCG tablet   Oral   Take 175 mcg by mouth daily before breakfast.         . losartan-hydrochlorothiazide (HYZAAR) 100-25 MG per tablet   Oral   Take 1 tablet by mouth daily.         . metoprolol (LOPRESSOR) 50 MG tablet   Oral   Take 1 tablet (50 mg total) by mouth 2 (two) times daily. For high blood pressure   60 tablet   0   . pravastatin (PRAVACHOL) 40 MG tablet   Oral   Take 1 tablet (40 mg total) by mouth daily.   30 tablet   0     Please fill this prescription.  Disregard prior or ...   . tiotropium (SPIRIVA) 18 MCG inhalation capsule  Inhalation   Place 18 mcg into inhaler and inhale daily.         Marland Kitchen HYDROcodone-acetaminophen (NORCO/VICODIN) 5-325 MG per tablet   Oral   Take 1 tablet by mouth every 6 (six) hours as needed for moderate pain.   10 tablet   0    BP 160/78  Pulse 71  Temp(Src) 97.8 F (36.6 C) (Oral)  Resp 16  SpO2 95% Physical Exam  Nursing note and vitals reviewed. Constitutional: He is oriented to person, place, and time. No distress.  Elderly  HENT:  Head: Normocephalic and atraumatic.  Eyes: EOM are normal. Pupils are equal, round, and reactive to light.  Neck: Neck supple.  Cardiovascular: Normal rate, regular rhythm and normal heart sounds.   No murmur heard. Pulmonary/Chest: Effort normal and breath sounds normal. No respiratory distress. He has no wheezes.  Abdominal: Soft. There is no tenderness.  Musculoskeletal: He exhibits no edema.  Limited ROM of the left hip 2/2 pain, TTP over the  greater trochanter.  Full ROM at the knee.    Lymphadenopathy:    He has no cervical adenopathy.  Neurological: He is alert and oriented to person, place, and time. No cranial nerve deficit. Coordination normal.  NO clonus noted.  5/5 strength in BLU extremity.  5/5 LE plantar and dorsiflexion, quadraceps and hamstring.  Patient able to hold his left leg up at the hip flexer against gravity and without drift but will not lift the leg off the bed 2/2 pain.  Skin: Skin is warm and dry.  Psychiatric: He has a normal mood and affect.    ED Course  Procedures (including critical care time) Labs Review Labs Reviewed - No data to display Imaging Review Dg Hip Complete Left  02/18/2013   CLINICAL DATA:  Left posterior hip discomfort.  EXAM: LEFT HIP - COMPLETE 2+ VIEW  COMPARISON:  None.  FINDINGS: AP and lateral views of the left hip reveal the bones to be adequately mineralized. There is no evidence of an acute fracture nor dislocation. The observed portions of the pelvis exhibit no acute abnormalities. The overlying soft tissues exhibit no abnormal densities or contours.  IMPRESSION: There is no acute bony abnormality of the left hip. No significant degenerative change is demonstrated either.   Electronically Signed   By: David  [place]   On: 02/18/2013 15:39   Ct Pelvis Wo Contrast  02/18/2013   CLINICAL DATA:  Left hip pain.  EXAM: CT PELVIS WITHOUT CONTRAST  TECHNIQUE: Multidetector CT imaging of the pelvis was performed following the standard protocol without intravenous contrast.  COMPARISON:  Correlated with plain film evaluation dated 02/18/2013.  FINDINGS: There is no evidence of fracture, dislocation or malalignment. Mild areas of hypertrophic bone spurring identified along the periphery of the acetabular regions. There also areas of mild subchondral cyst formation. Noncontrast evaluation of the intrapelvic contents demonstrate no evidence of free fluid, loculated fluid collections, masses, nor  adenopathy. There is no evidence of bowel obstruction. Atherosclerotic calcifications identified within the distal abdominal aorta and iliac vessels. There is ectasia of the distal abdominal aorta measuring at 2.5 x 2.9 cm in AP by transverse dimensions.  IMPRESSION: 1. 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 2. No evidence of obstructive or inflammatory abnormalities and visualized of the pelvis 3. Ectasia of the distal abdominal aorta and atherosclerotic disease changes.   Electronically Signed   By: Salome Holmes M.D.   On: 02/18/2013 16:41  EKG Interpretation   None       MDM   1. Hip pain, acute, left    Patient presents with acute left hip pain. No evidence of injury. Patient is nontoxic-appearing. His neurologic exam is within normal limits and nonlateralizing.  Patient endorses pain with flexion of the hip and will not lift his left leg off the bed. I feel this is likely secondary to pain and not neurologic deficit. Have low suspicion for intracranial process including TIA or stroke given patient's pain and physical exam.  He does have tenderness to palpation over the left hip. Plain films are negative. We'll get a CT scan of the hip for persistent pain.  5:23 PM Patient had improvement of his pain with Norco. He was ambulatory to CT scan. Patient now reporting return of pain. Will be given second hydrocodone and ambulated without difficulty. Patient now states his pain is worse when he lays on that side. Given tenderness to palpation over the greater trochanter and pain with lying on the left side, patient may have trochanteric bursitis.  Review of patient's prior lab work shows good GFR. Patient will be given a short course of Norco for his pain. He is encouraged to add ibuprofen for the next few days. He is to followup with his primary care physician.  After history, exam, and medical workup I feel the patient has been appropriately  medically screened and is safe for discharge home. Pertinent diagnoses were discussed with the patient. Patient was given return precautions.   Shon Baton, MD 02/18/13 [id number]

## 2013-02-18 NOTE — ED Notes (Addendum)
Per EMS, pt was walking in his house when he states he lost control of his left leg this morning at 930. Pt cannot move his left leg, states it "feels paralyzed" and has 9/10 pain in his left leg. Pt states he had a TIA 3 months ago. Pt denies having any previous left hip problems.

## 2013-05-07 ENCOUNTER — Inpatient Hospital Stay (HOSPITAL_COMMUNITY)
Admission: EM | Admit: 2013-05-07 | Discharge: 2013-05-09 | DRG: 282 | Disposition: A | Discharge status: Home / Self Care | Payer: Medicare Other | Attending: Internal Medicine | Admitting: Internal Medicine

## 2013-05-07 ENCOUNTER — Encounter (HOSPITAL_COMMUNITY): Payer: Self-pay | Admitting: Emergency Medicine

## 2013-05-07 ENCOUNTER — Emergency Department (HOSPITAL_COMMUNITY): Payer: Medicare Other

## 2013-05-07 DIAGNOSIS — Z8673 Personal history of transient ischemic attack (TIA), and cerebral infarction without residual deficits: Secondary | ICD-10-CM

## 2013-05-07 DIAGNOSIS — I214 Non-ST elevation (NSTEMI) myocardial infarction: Principal | ICD-10-CM | POA: Diagnosis present

## 2013-05-07 DIAGNOSIS — R079 Chest pain, unspecified: Secondary | ICD-10-CM | POA: Diagnosis present

## 2013-05-07 DIAGNOSIS — I10 Essential (primary) hypertension: Secondary | ICD-10-CM | POA: Diagnosis present

## 2013-05-07 DIAGNOSIS — I959 Hypotension, unspecified: Secondary | ICD-10-CM | POA: Diagnosis present

## 2013-05-07 DIAGNOSIS — I200 Unstable angina: Secondary | ICD-10-CM | POA: Diagnosis present

## 2013-05-07 DIAGNOSIS — I25810 Atherosclerosis of coronary artery bypass graft(s) without angina pectoris: Secondary | ICD-10-CM

## 2013-05-07 DIAGNOSIS — G459 Transient cerebral ischemic attack, unspecified: Secondary | ICD-10-CM

## 2013-05-07 DIAGNOSIS — R7401 Elevation of levels of liver transaminase levels: Secondary | ICD-10-CM | POA: Diagnosis present

## 2013-05-07 DIAGNOSIS — R740 Nonspecific elevation of levels of transaminase and lactic acid dehydrogenase [LDH]: Secondary | ICD-10-CM

## 2013-05-07 DIAGNOSIS — Z800 Family history of malignant neoplasm of digestive organs: Secondary | ICD-10-CM

## 2013-05-07 DIAGNOSIS — I2510 Atherosclerotic heart disease of native coronary artery without angina pectoris: Secondary | ICD-10-CM | POA: Diagnosis present

## 2013-05-07 DIAGNOSIS — F17200 Nicotine dependence, unspecified, uncomplicated: Secondary | ICD-10-CM | POA: Diagnosis present

## 2013-05-07 DIAGNOSIS — Z951 Presence of aortocoronary bypass graft: Secondary | ICD-10-CM

## 2013-05-07 DIAGNOSIS — Z720 Tobacco use: Secondary | ICD-10-CM

## 2013-05-07 DIAGNOSIS — R7402 Elevation of levels of lactic acid dehydrogenase (LDH): Secondary | ICD-10-CM | POA: Diagnosis present

## 2013-05-07 DIAGNOSIS — E785 Hyperlipidemia, unspecified: Secondary | ICD-10-CM

## 2013-05-07 DIAGNOSIS — E7800 Pure hypercholesterolemia, unspecified: Secondary | ICD-10-CM | POA: Diagnosis present

## 2013-05-07 DIAGNOSIS — J449 Chronic obstructive pulmonary disease, unspecified: Secondary | ICD-10-CM

## 2013-05-07 HISTORY — DX: Pure hypercholesterolemia, unspecified: E78.00

## 2013-05-07 HISTORY — DX: Chronic obstructive pulmonary disease, unspecified: J44.9

## 2013-05-07 HISTORY — DX: Disorder of thyroid, unspecified: E07.9

## 2013-05-07 HISTORY — DX: Tobacco use: Z72.0

## 2013-05-07 HISTORY — DX: Obesity, unspecified: E66.9

## 2013-05-07 LAB — COMPREHENSIVE METABOLIC PANEL
ALBUMIN: 3.5 g/dL (ref 3.5–5.2)
ALK PHOS: 77 U/L (ref 39–117)
ALT: 62 U/L — ABNORMAL HIGH (ref 0–53)
AST: 50 U/L — ABNORMAL HIGH (ref 0–37)
BUN: 14 mg/dL (ref 6–23)
CO2: 28 mEq/L (ref 19–32)
Calcium: 9.3 mg/dL (ref 8.4–10.5)
Chloride: 99 mEq/L (ref 96–112)
Creatinine, Ser: 1.1 mg/dL (ref 0.50–1.35)
GFR calc non Af Amer: 67 mL/min — ABNORMAL LOW (ref >90)
GFR, EST AFRICAN AMERICAN: 77 mL/min — AB (ref >90)
GLUCOSE: 235 mg/dL — AB (ref 70–99)
POTASSIUM: 3.9 meq/L (ref 3.7–5.3)
SODIUM: 139 meq/L (ref 137–147)
Total Bilirubin: 0.4 mg/dL (ref 0.3–1.2)
Total Protein: 7 g/dL (ref 6.0–8.3)

## 2013-05-07 LAB — CBC WITH DIFFERENTIAL/PLATELET
Basophils Absolute: 0 10*3/uL (ref 0.0–0.1)
Basophils Relative: 0 % (ref 0–1)
Eosinophils Absolute: 0.1 10*3/uL (ref 0.0–0.7)
Eosinophils Relative: 2 % (ref 0–5)
HCT: 46.1 % (ref 39.0–52.0)
Hemoglobin: 16.4 g/dL (ref 13.0–17.0)
LYMPHS ABS: 2.4 10*3/uL (ref 0.7–4.0)
Lymphocytes Relative: 38 % (ref 12–46)
MCH: 29 pg (ref 26.0–34.0)
MCHC: 35.6 g/dL (ref 30.0–36.0)
MCV: 81.6 fL (ref 78.0–100.0)
Monocytes Absolute: 0.5 10*3/uL (ref 0.1–1.0)
Monocytes Relative: 7 % (ref 3–12)
NEUTROS PCT: 53 % (ref 43–77)
Neutro Abs: 3.4 10*3/uL (ref 1.7–7.7)
PLATELETS: 122 10*3/uL — AB (ref 150–400)
RBC: 5.65 MIL/uL (ref 4.22–5.81)
RDW: 13.7 % (ref 11.5–15.5)
WBC: 6.5 10*3/uL (ref 4.0–10.5)

## 2013-05-07 LAB — TROPONIN I: Troponin I: <0.3 ng/mL (ref <0.30)

## 2013-05-07 MED ORDER — ONDANSETRON HCL 4 MG/2ML IJ SOLN
4.0000 mg | Freq: Once | INTRAMUSCULAR | Status: AC
  Administered 2013-05-07: 4 mg via INTRAVENOUS
  Filled 2013-05-07: qty 2

## 2013-05-07 MED ORDER — NITROGLYCERIN IN D5W 200-5 MCG/ML-% IV SOLN
5.0000 ug/min | Freq: Once | INTRAVENOUS | Status: DC
Start: 2013-05-07 — End: 2013-05-08
  Filled 2013-05-07: qty 250

## 2013-05-07 MED ORDER — MORPHINE SULFATE 4 MG/ML IJ SOLN: 4.0000 mg | INTRAMUSCULAR | Status: DC | PRN

## 2013-05-07 NOTE — ED Notes (Signed)
MD at bedside. 

## 2013-05-07 NOTE — H&P (Signed)
[facility] History and Physical  Randy Carpenter URK:[id number] DOB: [birth year] DOA: 05/07/2013   PCP: Sherrie Mustache, MD  Specialists: None  Chief Complaint: Chest pain  HPI: Randy Carpenter is a 68 y.o. male with a past medical history of coronary artery disease, status post CABG, hypertension, who was in his usual state of health about 9:15 PM tonight when he was watching TV and he started experiencing chest pain in the retrosternal area. There was a knife like sensation, 6/10 in intensity without any radiation. There was no aggravating or precipitating factor. He felt short of breath, slightly dizzy. Denies any syncopal episode. No nausea, no leg swelling recently. No symptoms suggestive of diaphoresis. No fever. No chills. He took 4 baby aspirin. He called EMS who gave him nitroglycerin tablet. He was given another nitroglycerin after which the pain appeared to subside. He is currently without any chest pain. Denies any acid reflux. He had similar pain about 5-6 months ago, which resolved after 10 minutes without intervention. He is not sure when he had his CABG. He had states that he has had a stress test since his CABG but unable to tell me the timeline on that either. Not a very good historian.  Home Medications: Prior to Admission medications   Medication Sig Start Date End Date Taking? Authorizing Provider  amLODipine (NORVASC) 2.5 MG tablet Take 2.5 mg by mouth daily.    Historical Provider, MD  aspirin 81 MG tablet Take 1 tablet (81 mg total) by mouth daily. 09/21/12   Hester Mates, MD  fenofibrate 160 MG tablet Take 160 mg by mouth daily after breakfast.    Historical Provider, MD  HYDROcodone-acetaminophen (NORCO/VICODIN) 5-325 MG per tablet Take 1 tablet by mouth every 6 (six) hours as needed for moderate pain (pain).    Historical Provider, MD  HYDROcodone-acetaminophen (NORCO/VICODIN) 5-325 MG per tablet Take 1 tablet by mouth every 6 (six) hours as needed for  moderate pain. 02/18/13   Merryl Hacker, MD  levothyroxine (SYNTHROID, LEVOTHROID) 175 MCG tablet Take 175 mcg by mouth daily before breakfast.    Historical Provider, MD  losartan-hydrochlorothiazide (HYZAAR) 100-25 MG per tablet Take 1 tablet by mouth daily.    Historical Provider, MD  metoprolol (LOPRESSOR) 50 MG tablet Take 1 tablet (50 mg total) by mouth 2 (two) times daily. For high blood pressure 09/21/12   Hester Mates, MD  pravastatin (PRAVACHOL) 40 MG tablet Take 1 tablet (40 mg total) by mouth daily. 09/21/12   Hester Mates, MD  tiotropium (SPIRIVA) 18 MCG inhalation capsule Place 18 mcg into inhaler and inhale daily.    Historical Provider, MD    Allergies: No Known Allergies  Past Medical History: Past Medical History  Diagnosis Date  . Hypertension   . CAD (coronary artery disease), autologous vein bypass graft     s/p CABG    Past Surgical History  Procedure Laterality Date  . Coronary artery bypass graft      Social History: Patient lives in a retirement facility by himself. He continues to smoke cigarettes. He states he smokes about 3-4 packs of cigarettes on a monthly basis. No alcohol use. No illicit drug use and apparently, daily activities.  Family History:  Family History  Problem Relation Age of Onset  . Pancreatic cancer Mother      Review of Systems - History obtained from the patient General ROS: positive for  - fatigue Psychological ROS: negative Ophthalmic ROS: negative ENT ROS: negative  Allergy and Immunology ROS: negative Hematological and Lymphatic ROS: negative Endocrine ROS: negative Respiratory ROS: as in hpi Cardiovascular ROS: as in hpi Gastrointestinal ROS: no abdominal pain, change in bowel habits, or black or bloody stools Genito-Urinary ROS: no dysuria, trouble voiding, or hematuria Musculoskeletal ROS: negative Neurological ROS: no TIA or stroke symptoms Dermatological ROS: negative  Physical Examination  Filed Vitals:    05/07/13 2141 05/07/13 2237 05/07/13 2239 05/07/13 2300  BP: 145/66 136/61  120/63  Pulse: 73 72  73  Temp: 98.1 F (36.7 C)     TempSrc: Oral     Resp: 20  16   SpO2: 94% 95%  93%    General appearance: alert, cooperative, appears stated age, no distress and moderately obese Head: Normocephalic, without obvious abnormality, atraumatic Eyes: conjunctivae/corneas clear. PERRL, EOM's intact.  Throat: lips, mucosa, and tongue normal; teeth and gums normal Neck: no adenopathy, no carotid bruit, no JVD, supple, symmetrical, trachea midline and thyroid not enlarged, symmetric, no tenderness/mass/nodules Resp: clear to auscultation bilaterally Cardio: S1-S2 is normal. Regular. No S3, S4. No rubs, or bruit. Systolic murmur 3/6 appreciated over the precordium. No pedal edema. No JVD. GI: soft, non-tender; bowel sounds normal; no masses,  no organomegaly Extremities: extremities normal, atraumatic, no cyanosis or edema Pulses: 2+ and symmetric Skin: Skin color, texture, turgor normal. No rashes or lesions Lymph nodes: Cervical, supraclavicular, and axillary nodes normal. Neurologic: Alert and oriented x3. No focal neurological deficits are present  Laboratory Data: Results for orders placed during the hospital encounter of 05/07/13 (from the past 48 hour(s))  TROPONIN I     Status: None   Collection Time    05/07/13 10:02 PM      Result Value Ref Range   Troponin I <0.30  <0.30 ng/mL   Comment:            Due to the release kinetics of cTnI,     a negative result within the first hours     of the onset of symptoms does not rule out     myocardial infarction with certainty.     If myocardial infarction is still suspected,     repeat the test at appropriate intervals.  CBC WITH DIFFERENTIAL     Status: Abnormal   Collection Time    05/07/13 10:02 PM      Result Value Ref Range   WBC 6.5  4.0 - 10.5 K/uL   RBC 5.65  4.22 - 5.81 MIL/uL   Hemoglobin 16.4  13.0 - 17.0 g/dL   HCT 46.1   39.0 - 52.0 %   MCV 81.6  78.0 - 100.0 fL   MCH 29.0  26.0 - 34.0 pg   MCHC 35.6  30.0 - 36.0 g/dL   RDW 13.7  11.5 - 15.5 %   Platelets 122 (*) 150 - 400 K/uL   Neutrophils Relative % 53  43 - 77 %   Neutro Abs 3.4  1.7 - 7.7 K/uL   Lymphocytes Relative 38  12 - 46 %   Lymphs Abs 2.4  0.7 - 4.0 K/uL   Monocytes Relative 7  3 - 12 %   Monocytes Absolute 0.5  0.1 - 1.0 K/uL   Eosinophils Relative 2  0 - 5 %   Eosinophils Absolute 0.1  0.0 - 0.7 K/uL   Basophils Relative 0  0 - 1 %   Basophils Absolute 0.0  0.0 - 0.1 K/uL  COMPREHENSIVE METABOLIC PANEL     Status:  Abnormal   Collection Time    05/07/13 10:02 PM      Result Value Ref Range   Sodium 139  137 - 147 mEq/L   Potassium 3.9  3.7 - 5.3 mEq/L   Chloride 99  96 - 112 mEq/L   CO2 28  19 - 32 mEq/L   Glucose, Bld 235 (*) 70 - 99 mg/dL   BUN 14  6 - 23 mg/dL   Creatinine, Ser 1.10  0.50 - 1.35 mg/dL   Calcium 9.3  8.4 - 10.5 mg/dL   Total Protein 7.0  6.0 - 8.3 g/dL   Albumin 3.5  3.5 - 5.2 g/dL   AST 50 (*) 0 - 37 U/L   ALT 62 (*) 0 - 53 U/L   Alkaline Phosphatase 77  39 - 117 U/L   Total Bilirubin 0.4  0.3 - 1.2 mg/dL   GFR calc non Af Amer 67 (*) >90 mL/min   GFR calc Af Amer 77 (*) >90 mL/min   Comment: (NOTE)     The eGFR has been calculated using the CKD EPI equation.     This calculation has not been validated in all clinical situations.     eGFR's persistently <90 mL/min signify possible Chronic Kidney     Disease.    Radiology Reports: Dg Chest 2 View  05/07/2013   CLINICAL DATA:  Chest pain and shortness of breath  EXAM: CHEST  2 VIEW  COMPARISON:  09/21/2012  FINDINGS: Cardiomegaly and CABG changes noted.  COPD/emphysema changes noted.  There is no evidence of focal airspace disease, pulmonary edema, suspicious pulmonary nodule/mass, pleural effusion, or pneumothorax. No acute bony abnormalities are identified.  IMPRESSION: No evidence of acute cardiopulmonary disease.  Cardiomegaly and COPD.   Electronically  Signed   By: Hassan Rowan M.D.   On: 05/07/2013 22:30    Electrocardiogram: Sinus rhythm at 71 beats per minute. Normal axis. First degree AV block is present. Subtle ST changes in lateral leads and in V4. Repeat EKG shows similar changes. Previous EKG was reviewed and showed similar changes.  Problem List  Principal Problem:   Chest pain Active Problems:   Hx of CABG   HTN (hypertension)   CAD (coronary artery disease)   Assessment: This is a 68 year old, Caucasian male, who has a history of CAD, who presents with chest pain. EKG is abnormal but does not show any dynamic changes. The pain did resolve with the nitroglycerin hence, angina is most likely. Other differential diagnoses include GERD and VTE, both of which are less likely.  Plan: #1 chest pain: He'll be observed overnight. Troponin will be trended. Cardiology will be consulted in the morning. Continue with aspirin. Nitroglycerin ointment.  #2 known history of CAD, status post CABG: Continue with his antiplatelet agent and beta blocker along with his cholesterol-lowering medication. Of note, he had an echocardiogram in July of last year, which showed normal EF, without any wall motion abnormalities.  #3 mild transaminitis: Could be from statin medication. Has been noted to be elevated in the past as well. No known history of liver disease. We will check a hepatitis panel in the morning.  #4 history of hypertension: Continue with his antihypertensive agents.   DVT Prophylaxis: Lovenox Code Status: Full code Family Communication: Discussed with the patient  Disposition Plan: Observe to telemetry   Further management decisions will depend on results of further testing and patient's response to treatment.  [facility] Pager [phone]  If 7PM-7AM,  please contact night-coverage [email or web address] Password [facility]  05/07/2013, 11:54 PM

## 2013-05-07 NOTE — ED Notes (Signed)
Pt c/o mid-sternal chest pain with SOB starting at roughly 9:15 this evening with some dizziness. Per EMS: pt was given 1 Nitro and 4 ASA PTA.

## 2013-05-07 NOTE — ED Notes (Signed)
Pt. States that he is having no chest discomfort, pain, heaviness, shortness of breath at this time. Discussed with EDP. EDP advised to hold nitro at this time.

## 2013-05-07 NOTE — ED Provider Notes (Signed)
CSN: [id number]     Arrival date & time 05/07/13  2134 History   First MD Initiated Contact with Patient 05/07/13 2138     Chief Complaint  Patient presents with  . Chest Pain  . Shortness of Breath     HPI  CC Randy Carpenter no history of known coronary artery disease status post bypass grafting 5 or 7 years ago. Does not follow with cardiology since. His hypertensive, does take his medications. Continues to smoke. Had an episode tonight while sitting in his chair at his home watching television. Tightness in his chest radiating to his left arm and fingers. He states he did feel burning at the base of his throat. No nausea. States it is reminiscent of pain he had before he had his bypass surgery. He did not infarct to his knowledge. The recent exertional symptoms or prior episodes of similar pain since his bypass surgery. Has not had other recent illnesses. History of COPD without recent exacerbation.  Past Medical History  Diagnosis Date  . Hypertension   . CAD (coronary artery disease), autologous vein bypass graft     s/p CABG   Past Surgical History  Procedure Laterality Date  . Coronary artery bypass graft     No family history on file. History  Substance Use Topics  . Smoking status: Current Every Day Smoker -- 0.50 packs/day for 59 years    Types: Cigarettes  . Smokeless tobacco: Not on file  . Alcohol Use: No    Review of Systems  Constitutional: Negative for fever, chills, diaphoresis, appetite change and fatigue.  HENT: Negative for mouth sores, sore throat and trouble swallowing.   Eyes: Negative for visual disturbance.  Respiratory: Positive for shortness of breath. Negative for cough, chest tightness and wheezing.   Cardiovascular: Positive for chest pain.  Gastrointestinal: Negative for nausea, vomiting, abdominal pain, diarrhea and abdominal distention.  Endocrine: Negative for polydipsia, polyphagia and polyuria.  Genitourinary: Negative for dysuria, frequency and  hematuria.  Musculoskeletal: Negative for gait problem.  Skin: Negative for color change, pallor and rash.  Neurological: Negative for dizziness, syncope, light-headedness and headaches.  Hematological: Does not bruise/bleed easily.  Psychiatric/Behavioral: Negative for behavioral problems and confusion.      Allergies  Review of patient's allergies indicates no known allergies.  Home Medications   Current Outpatient Rx  Name  Route  Sig  Dispense  Refill  . amLODipine (NORVASC) 2.5 MG tablet   Oral   Take 2.5 mg by mouth daily.         Marland Kitchen aspirin 81 MG tablet   Oral   Take 1 tablet (81 mg total) by mouth daily.   30 tablet   0   . fenofibrate 160 MG tablet   Oral   Take 160 mg by mouth daily after breakfast.         . HYDROcodone-acetaminophen (NORCO/VICODIN) 5-325 MG per tablet   Oral   Take 1 tablet by mouth every 6 (six) hours as needed for moderate pain (pain).         Marland Kitchen HYDROcodone-acetaminophen (NORCO/VICODIN) 5-325 MG per tablet   Oral   Take 1 tablet by mouth every 6 (six) hours as needed for moderate pain.   10 tablet   0   . levothyroxine (SYNTHROID, LEVOTHROID) 175 MCG tablet   Oral   Take 175 mcg by mouth daily before breakfast.         . losartan-hydrochlorothiazide (HYZAAR) 100-25 MG per tablet   Oral  Take 1 tablet by mouth daily.         . metoprolol (LOPRESSOR) 50 MG tablet   Oral   Take 1 tablet (50 mg total) by mouth 2 (two) times daily. For high blood pressure   60 tablet   0   . pravastatin (PRAVACHOL) 40 MG tablet   Oral   Take 1 tablet (40 mg total) by mouth daily.   30 tablet   0     Please fill this prescription.  Disregard prior or ...   . tiotropium (SPIRIVA) 18 MCG inhalation capsule   Inhalation   Place 18 mcg into inhaler and inhale daily.          BP 120/63  Pulse 73  Temp(Src) 98.1 F (36.7 C) (Oral)  Resp 16  SpO2 93% Physical Exam  Constitutional: He is oriented to person, place, and time. He  appears well-developed and well-nourished. No distress.  HENT:  Head: Normocephalic.  Eyes: Conjunctivae are normal. Pupils are equal, round, and reactive to light. No scleral icterus.  Neck: Normal range of motion. Neck supple. No thyromegaly present.  Cardiovascular: Normal rate and regular rhythm.  Exam reveals no gallop and no friction rub.   No murmur heard. Pulmonary/Chest: Effort normal and breath sounds normal. No respiratory distress. He has no wheezes. He has no rales.  Abdominal: Soft. Bowel sounds are normal. He exhibits no distension. There is no tenderness. There is no rebound.  Musculoskeletal: Normal range of motion.  Neurological: He is alert and oriented to person, place, and time.  Skin: Skin is warm and dry. No rash noted.  Psychiatric: He has a normal mood and affect. His behavior is normal.    ED Course  Procedures (including critical care time) Labs Review Labs Reviewed  CBC WITH DIFFERENTIAL - Abnormal; Notable for the following:    Platelets 122 (*)    All other components within normal limits  COMPREHENSIVE METABOLIC PANEL - Abnormal; Notable for the following:    Glucose, Bld 235 (*)    AST 50 (*)    ALT 62 (*)    GFR calc non Af Amer 67 (*)    GFR calc Af Amer 77 (*)    All other components within normal limits  TROPONIN I   Imaging Review Dg Chest 2 View  05/07/2013   CLINICAL DATA:  Chest pain and shortness of breath  EXAM: CHEST  2 VIEW  COMPARISON:  09/21/2012  FINDINGS: Cardiomegaly and CABG changes noted.  COPD/emphysema changes noted.  There is no evidence of focal airspace disease, pulmonary edema, suspicious pulmonary nodule/mass, pleural effusion, or pneumothorax. No acute bony abnormalities are identified.  IMPRESSION: No evidence of acute cardiopulmonary disease.  Cardiomegaly and COPD.   Electronically Signed   By: Hassan Rowan M.D.   On: 05/07/2013 22:30    EKG Interpretation    Date/Time:  Wednesday May 07 2013 21:44:20  EST Ventricular Rate:  71 PR Interval:  248 QRS Duration: 102 QT Interval:  436 QTC Calculation: 473 R Axis:   5 Text Interpretation:  Sinus rhythm with 1st degree A-V block Nonspecific ST and T wave abnormality Prolonged QT Abnormal ECG Confirmed by Jeneen Rinks  MD, [place] ([id number]) on 05/07/2013 10:09:09 PM            MDM   Final diagnoses:  Chest pain    Patient became pain-free after only Zofran here. Repeat EKG showed no dynamic changes. I discussed the case with Dr. Colon Flattery of cardiology. He  felt the patient per my description to be admitted to observation here to the hospitalist and ruled out. He can be seen in consultation by cardiology here.  Discussed with patient. He is symptom-free.    Tanna Furry, MD 05/10/13 1910

## 2013-05-08 ENCOUNTER — Encounter (HOSPITAL_COMMUNITY): Payer: Self-pay | Admitting: *Deleted

## 2013-05-08 DIAGNOSIS — Z951 Presence of aortocoronary bypass graft: Secondary | ICD-10-CM

## 2013-05-08 DIAGNOSIS — I25810 Atherosclerosis of coronary artery bypass graft(s) without angina pectoris: Secondary | ICD-10-CM

## 2013-05-08 DIAGNOSIS — I517 Cardiomegaly: Secondary | ICD-10-CM

## 2013-05-08 DIAGNOSIS — R740 Nonspecific elevation of levels of transaminase and lactic acid dehydrogenase [LDH]: Secondary | ICD-10-CM

## 2013-05-08 DIAGNOSIS — G459 Transient cerebral ischemic attack, unspecified: Secondary | ICD-10-CM

## 2013-05-08 DIAGNOSIS — E785 Hyperlipidemia, unspecified: Secondary | ICD-10-CM

## 2013-05-08 DIAGNOSIS — I214 Non-ST elevation (NSTEMI) myocardial infarction: Secondary | ICD-10-CM | POA: Diagnosis present

## 2013-05-08 DIAGNOSIS — J449 Chronic obstructive pulmonary disease, unspecified: Secondary | ICD-10-CM

## 2013-05-08 DIAGNOSIS — I200 Unstable angina: Secondary | ICD-10-CM | POA: Diagnosis present

## 2013-05-08 DIAGNOSIS — I959 Hypotension, unspecified: Secondary | ICD-10-CM

## 2013-05-08 DIAGNOSIS — F17200 Nicotine dependence, unspecified, uncomplicated: Secondary | ICD-10-CM

## 2013-05-08 DIAGNOSIS — R7402 Elevation of levels of lactic acid dehydrogenase (LDH): Secondary | ICD-10-CM

## 2013-05-08 DIAGNOSIS — R7401 Elevation of levels of liver transaminase levels: Secondary | ICD-10-CM | POA: Diagnosis present

## 2013-05-08 LAB — COMPREHENSIVE METABOLIC PANEL
ALK PHOS: 72 U/L (ref 39–117)
ALT: 52 U/L (ref 0–53)
AST: 44 U/L — ABNORMAL HIGH (ref 0–37)
Albumin: 3.3 g/dL — ABNORMAL LOW (ref 3.5–5.2)
BUN: 16 mg/dL (ref 6–23)
CO2: 28 meq/L (ref 19–32)
Calcium: 8.9 mg/dL (ref 8.4–10.5)
Chloride: 102 mEq/L (ref 96–112)
Creatinine, Ser: 1.3 mg/dL (ref 0.50–1.35)
GFR calc Af Amer: 63 mL/min — ABNORMAL LOW (ref >90)
GFR, EST NON AFRICAN AMERICAN: 54 mL/min — AB (ref >90)
Glucose, Bld: 105 mg/dL — ABNORMAL HIGH (ref 70–99)
POTASSIUM: 3.8 meq/L (ref 3.7–5.3)
SODIUM: 142 meq/L (ref 137–147)
Total Bilirubin: 0.5 mg/dL (ref 0.3–1.2)
Total Protein: 6.4 g/dL (ref 6.0–8.3)

## 2013-05-08 LAB — CBC
HCT: 46 % (ref 39.0–52.0)
Hemoglobin: 16 g/dL (ref 13.0–17.0)
MCH: 28.3 pg (ref 26.0–34.0)
MCHC: 34.8 g/dL (ref 30.0–36.0)
MCV: 81.4 fL (ref 78.0–100.0)
Platelets: 124 10*3/uL — ABNORMAL LOW (ref 150–400)
RBC: 5.65 MIL/uL (ref 4.22–5.81)
RDW: 13.9 % (ref 11.5–15.5)
WBC: 7 10*3/uL (ref 4.0–10.5)

## 2013-05-08 LAB — CK TOTAL AND CKMB (NOT AT ARMC)
CK, MB: 6.2 ng/mL (ref 0.3–4.0)
Relative Index: 5.5 — ABNORMAL HIGH (ref 0.0–2.5)
Total CK: 112 U/L (ref 7–232)

## 2013-05-08 LAB — HEPARIN LEVEL (UNFRACTIONATED)
HEPARIN UNFRACTIONATED: 0.28 [IU]/mL — AB (ref 0.30–0.70)
Heparin Unfractionated: 0.18 IU/mL — ABNORMAL LOW (ref 0.30–0.70)

## 2013-05-08 LAB — HEPATITIS PANEL, ACUTE
HCV Ab: NEGATIVE
HEP A IGM: NONREACTIVE
Hep B C IgM: NONREACTIVE
Hepatitis B Surface Ag: NEGATIVE

## 2013-05-08 LAB — PROTIME-INR
INR: 1.13 (ref 0.00–1.49)
PROTHROMBIN TIME: 14.3 s (ref 11.6–15.2)

## 2013-05-08 LAB — TROPONIN I
TROPONIN I: 1.09 ng/mL — AB (ref <0.30)
Troponin I: <0.3 ng/mL (ref <0.30)
Troponin I: 0.97 ng/mL (ref <0.30)

## 2013-05-08 LAB — APTT: aPTT: 33 seconds (ref 24–37)

## 2013-05-08 MED ORDER — LEVOTHYROXINE SODIUM 75 MCG PO TABS
175.0000 ug | ORAL_TABLET | Freq: Every day | ORAL | Status: DC
  Administered 2013-05-08 – 2013-05-09 (×2): 175 ug via ORAL
  Filled 2013-05-08 (×4): qty 1

## 2013-05-08 MED ORDER — HEPARIN BOLUS VIA INFUSION
2000.0000 [IU] | Freq: Once | INTRAVENOUS | Status: AC
  Administered 2013-05-08: 2000 [IU] via INTRAVENOUS
  Filled 2013-05-08: qty 2000

## 2013-05-08 MED ORDER — ONDANSETRON HCL 4 MG PO TABS: 4.0000 mg | ORAL_TABLET | Freq: Four times a day (QID) | ORAL | Status: DC | PRN

## 2013-05-08 MED ORDER — ONDANSETRON HCL 4 MG/2ML IJ SOLN: 4.0000 mg | Freq: Four times a day (QID) | INTRAMUSCULAR | Status: DC | PRN

## 2013-05-08 MED ORDER — ASPIRIN 81 MG PO TABS
81.0000 mg | ORAL_TABLET | Freq: Every day | ORAL | Status: DC
  Filled 2013-05-08 (×2): qty 1

## 2013-05-08 MED ORDER — ENOXAPARIN SODIUM 40 MG/0.4ML ~~LOC~~ SOLN: 40.0000 mg | SUBCUTANEOUS | Status: DC

## 2013-05-08 MED ORDER — ASPIRIN 325 MG PO TABS
325.0000 mg | ORAL_TABLET | Freq: Every day | ORAL | Status: DC
  Administered 2013-05-08 – 2013-05-09 (×2): 325 mg via ORAL
  Filled 2013-05-08 (×2): qty 1

## 2013-05-08 MED ORDER — LOSARTAN POTASSIUM 50 MG PO TABS
100.0000 mg | ORAL_TABLET | Freq: Every day | ORAL | Status: DC
  Filled 2013-05-08: qty 2

## 2013-05-08 MED ORDER — TIOTROPIUM BROMIDE MONOHYDRATE 18 MCG IN CAPS
18.0000 ug | ORAL_CAPSULE | Freq: Every day | RESPIRATORY_TRACT | Status: DC
  Administered 2013-05-08 – 2013-05-09 (×2): 18 ug via RESPIRATORY_TRACT
  Filled 2013-05-08: qty 5

## 2013-05-08 MED ORDER — ALBUTEROL SULFATE (2.5 MG/3ML) 0.083% IN NEBU: 2.5000 mg | INHALATION_SOLUTION | RESPIRATORY_TRACT | Status: DC | PRN

## 2013-05-08 MED ORDER — SODIUM CHLORIDE 0.9 % IJ SOLN
3.0000 mL | Freq: Two times a day (BID) | INTRAMUSCULAR | Status: DC
  Administered 2013-05-08 – 2013-05-09 (×3): 3 mL via INTRAVENOUS

## 2013-05-08 MED ORDER — METOPROLOL TARTRATE 25 MG PO TABS
25.0000 mg | ORAL_TABLET | Freq: Two times a day (BID) | ORAL | Status: DC
  Administered 2013-05-08 – 2013-05-09 (×3): 25 mg via ORAL
  Filled 2013-05-08 (×3): qty 1

## 2013-05-08 MED ORDER — AMLODIPINE BESYLATE 5 MG PO TABS
2.5000 mg | ORAL_TABLET | Freq: Every day | ORAL | Status: DC
  Filled 2013-05-08: qty 1

## 2013-05-08 MED ORDER — ACETAMINOPHEN 650 MG RE SUPP
650.0000 mg | Freq: Four times a day (QID) | RECTAL | Status: DC | PRN
Start: 2013-05-08 — End: 2013-05-09

## 2013-05-08 MED ORDER — METOPROLOL TARTRATE 50 MG PO TABS
50.0000 mg | ORAL_TABLET | Freq: Two times a day (BID) | ORAL | Status: DC
  Administered 2013-05-08: 50 mg via ORAL
  Filled 2013-05-08 (×2): qty 1

## 2013-05-08 MED ORDER — SODIUM CHLORIDE 0.9 % IV SOLN
INTRAVENOUS | Status: DC
  Administered 2013-05-08 (×2): via INTRAVENOUS

## 2013-05-08 MED ORDER — LOSARTAN POTASSIUM-HCTZ 100-25 MG PO TABS: 1.0000 | ORAL_TABLET | Freq: Every day | ORAL | Status: DC

## 2013-05-08 MED ORDER — HEPARIN BOLUS VIA INFUSION
4000.0000 [IU] | Freq: Once | INTRAVENOUS | Status: AC
  Administered 2013-05-08: 4000 [IU] via INTRAVENOUS
  Filled 2013-05-08: qty 4000

## 2013-05-08 MED ORDER — ATORVASTATIN CALCIUM 40 MG PO TABS
40.0000 mg | ORAL_TABLET | Freq: Every day | ORAL | Status: DC
  Administered 2013-05-08: 40 mg via ORAL
  Filled 2013-05-08: qty 1

## 2013-05-08 MED ORDER — HYDROCHLOROTHIAZIDE 25 MG PO TABS
25.0000 mg | ORAL_TABLET | Freq: Every day | ORAL | Status: DC
  Filled 2013-05-08: qty 1

## 2013-05-08 MED ORDER — HYDROCODONE-ACETAMINOPHEN 5-325 MG PO TABS: 1.0000 | ORAL_TABLET | ORAL | Status: DC | PRN

## 2013-05-08 MED ORDER — SIMVASTATIN 20 MG PO TABS: 20.0000 mg | ORAL_TABLET | Freq: Every day | ORAL | Status: DC

## 2013-05-08 MED ORDER — MORPHINE SULFATE 2 MG/ML IJ SOLN: 2.0000 mg | INTRAMUSCULAR | Status: DC | PRN

## 2013-05-08 MED ORDER — HEPARIN (PORCINE) IN NACL 100-0.45 UNIT/ML-% IJ SOLN
1750.0000 [IU]/h | INTRAMUSCULAR | Status: DC
  Administered 2013-05-08 (×2): 1600 [IU]/h via INTRAVENOUS
  Administered 2013-05-08 (×2): 1200 [IU]/h via INTRAVENOUS
  Filled 2013-05-08 (×2): qty 250

## 2013-05-08 MED ORDER — NITROGLYCERIN 2 % TD OINT
0.5000 [in_us] | TOPICAL_OINTMENT | Freq: Four times a day (QID) | TRANSDERMAL | Status: DC
  Administered 2013-05-08: 0.5 [in_us] via TOPICAL
  Filled 2013-05-08: qty 1

## 2013-05-08 MED ORDER — ACETAMINOPHEN 325 MG PO TABS: 650.0000 mg | ORAL_TABLET | Freq: Four times a day (QID) | ORAL | Status: DC | PRN

## 2013-05-08 NOTE — Progress Notes (Signed)
[facility] PROGRESS NOTE  Randy Carpenter UXN:[id number] DOB: 11-21-1945 DOA: 05/07/2013 PCP: Sherrie Mustache, MD  Assessment/Plan: 1. Non-ST elevation MI with likely underlying unstable angina. Patient has a history of coronary disease with a CABG in the past. He has not had regular cardiology followup. He is ruled in for ACS with positive troponins. He was seen by cardiology and recommendations are for coronary angiography, although the patient has declined. Plan is to currently treat the patient medically with heparin, aspirin and beta blockers. He is also on statin. Heparin will be discontinued tomorrow and he will be started on Plavix. Echocardiogram has been ordered. 2. Hypertension. Stable 3. Ongoing tobacco use. Patient counseled regarding the importance of tobacco cessation 4. COPD. Stable 5. Transaminitis. Possibly facet related medication, although I believe the benefit of statin with outweigh risks at this time. We'll repeat liver function tests in the morning.  Code Status: full code Family Communication: discussed with patient Disposition Plan: pending hospital course   Consultants:  cardiology  Procedures:    Antibiotics:    HPI/Subjective: No chest pain since admission, no shortness of breath, became hypotensive with nitroglycerin paste, which quickly resolved with removal of paste  Objective: Filed Vitals:   05/08/13 1002  BP: 105/67  Pulse: 57  Temp:   Resp: 18    Intake/Output Summary (Last 24 hours) at 05/08/13 1401 Last data filed at 05/08/13 1006  Gross per 24 hour  Intake      0 ml  Output      0 ml  Net      0 ml   Filed Weights   05/08/13 0054  Weight: 104.055 kg (229 lb 6.4 oz)    Exam:   General:  NAD  Cardiovascular: S1, S2 RRR  Respiratory: CTA B  Abdomen: soft, nt, nd, bs+  Musculoskeletal: no edema b/l   Data Reviewed: Basic Metabolic Panel:  Recent Labs Lab 05/07/13 2202 05/08/13 0625  NA 139 142  K  3.9 3.8  CL 99 102  CO2 28 28  GLUCOSE 235* 105*  BUN 14 16  CREATININE 1.10 1.30  CALCIUM 9.3 8.9   Liver Function Tests:  Recent Labs Lab 05/07/13 2202 05/08/13 0625  AST 50* 44*  ALT 62* 52  ALKPHOS 77 72  BILITOT 0.4 0.5  PROT 7.0 6.4  ALBUMIN 3.5 3.3*   No results found for this basename: LIPASE, AMYLASE,  in the last 168 hours No results found for this basename: AMMONIA,  in the last 168 hours CBC:  Recent Labs Lab 05/07/13 2202 05/08/13 0625  WBC 6.5 7.0  NEUTROABS 3.4  --   HGB 16.4 16.0  HCT 46.1 46.0  MCV 81.6 81.4  PLT 122* 124*   Cardiac Enzymes:  Recent Labs Lab 05/07/13 2202 05/08/13 0102 05/08/13 0627 05/08/13 0730 05/08/13 1239  CKTOTAL  --   --   --  112  --   CKMB  --   --   --  PENDING  --   TROPONINI <0.30 <0.30 1.09*  --  0.97*   BNP (last 3 results) No results found for this basename: PROBNP,  in the last 8760 hours CBG: No results found for this basename: GLUCAP,  in the last 168 hours  No results found for this or any previous visit (from the past 240 hour(s)).   Studies: Dg Chest 2 View  05/07/2013   CLINICAL DATA:  Chest pain and shortness of breath  EXAM: CHEST  2 VIEW  COMPARISON:  09/21/2012  FINDINGS: Cardiomegaly and CABG changes noted.  COPD/emphysema changes noted.  There is no evidence of focal airspace disease, pulmonary edema, suspicious pulmonary nodule/mass, pleural effusion, or pneumothorax. No acute bony abnormalities are identified.  IMPRESSION: No evidence of acute cardiopulmonary disease.  Cardiomegaly and COPD.   Electronically Signed   By: Hassan Rowan M.D.   On: 05/07/2013 22:30    Scheduled Meds: . amLODipine  2.5 mg Oral Daily  . aspirin  325 mg Oral Daily  . hydrochlorothiazide  25 mg Oral Daily  . levothyroxine  175 mcg Oral QAC breakfast  . losartan  100 mg Oral Daily  . metoprolol  50 mg Oral BID  . simvastatin  20 mg Oral q1800  . sodium chloride  3 mL Intravenous Q12H  . tiotropium  18 mcg  Inhalation Daily   Continuous Infusions: . sodium chloride 10 mL/hr at 05/08/13 0121  . heparin 1,200 Units/hr (05/08/13 0813)    Principal Problem:   Chest pain Active Problems:   Hx of CABG   HTN (hypertension)   CAD (coronary artery disease)   Transaminitis    Time spent: 45mins    MEMON,JEHANZEB  [facility] Pager [phone]. If 7PM-7AM, please contact night-coverage at [email or web address], password [facility] 05/08/2013, 2:01 PM  LOS: 1 day

## 2013-05-08 NOTE — Consult Note (Signed)
The patient was seen and examined, and I agree with the assessment and plan as documented above, with modifications as noted below.  68 yr old male with h/o CAD and CABG in 2006, who has not followed up with cardiology since that time. For the past year, he has been experiencing intermittent chest pain 2-3 times a month which he has attributed to indigestion. He was making a chocolate cream pie yesterday and began experiencing chest pain, shortness of breath, and dizziness. He sat down and wondered whether or not it was due to "heartburn" which he experiences after eating bananas, which he had eaten shortly before this episode. When the pain did not resolve, he called 911 and was brought to the hospital.  ECG does not show evidence of dynamic changes. Became hypotensive with nitroglycerin, and antihypertensives were held this morning for the same reason. He is on ASA, heparin, metoprolol, and simvastatin 20 mg (on pravastatin 40 mg at home). AST 50, ALT 62. He is currently free of symptoms and eagerly wants to go home.   Soc: He has smoked 3 packs of cigarettes this past month, but smoked 20 packs/month for 60+ years. 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  Troponins peaked at 1.09, with most recent one trending down to 0.97.  No urgent necessity for coronary angiography given lack of symptoms at present and lack of ECG changes. Will obtain an echocardiogram and continue present medical therapy, but will substitute Lipitor for simvastatin. I had a lengthy discussion with the patient regarding coronary angiography, and he is not interested in pursuing an invasive strategy whatsoever. I plan to stop heparin on the morning of 2/27, and start Plavix 75 mg daily.  Preliminary look at LV systolic function looks normal.

## 2013-05-08 NOTE — Consult Note (Signed)
CARDIOLOGY CONSULT NOTE   Patient ID: ACESON LABELL MRN: [id number] DOB/AGE: [birth year] 68 y.o.  Admit Date: 05/07/2013 Referring Physician: PTH Primary Physician: Sherrie Mustache, MD Consulting Cardiologist: [facility] Primary Cardiologist New Reason for Consultation:   Clinical Summary Mr. Schiefelbein is a 68 y.o.male known history of CAD, CABG, hypertension, COPD,  Thyroid disease, hypercholesterolemia, and TIA, admitted with chest pain while standing in kitchen preparing a snack.. He has been lost to cardiac follow up since having CABG in 2006. He is a poor historian concerning cardiologist, or CVTS surgeon he has seen in the past. He continues to see his PCP for medical management.     He states that it felt like chest pressure, (blowing up from the inside out), with difficulty breathing. He states he had tingling in his left hand. Rested, but pressure did not improve. Called EMS. Was given NTG, with minimal relief. EKG was concerning for lateral ischemia, and he was convinced to come to ER.     On arrival to ER, BP 145/66 HR 73. EKG with T-wave depression in the lateral leads, but unchanged from prior EKG in 7 July of 2014. He states by the time he was treated in ER, pain subsided and he was back to normal.   CXR Cardiomegaly and COPD. First troponin X2 was negative, but 3rd set positive at 1.09. He states that he has been having intermittent chest discomfort for approx a year. He states it has been transient. Usually 2-3 times a month. This episode has been the worse he has felt. It is not similar to pain he experience prior to CABG.   He is pain free currently. He is compliant with medications. He unfortunately continues to smoke 1 1/2 packs a day, and has been smoking since age 27.     Review of [facility] which have been recently faxed, state that the patient had an abnormal Cardiolite study and 2006 per Dr. Dannielle Burn. This prompted cardiac catheterization. Cardiac  catheterization demonstrated three-vessel coronary artery disease with right coronary artery with total occlusion of his right coronary artery, 90% stenosis which is a complicated lesion in the LAD, 99% stenosis in OM 1 and OM 2, and function was not assessed because of borderline elevated creatinine.   He had subsequent coronary artery bypass grafting per Dr. Roxan Hockey. Records are still pending concerning the actual surgical bypass information.             No Known Allergies  Medications Scheduled Medications: . amLODipine  2.5 mg Oral Daily  . aspirin  325 mg Oral Daily  . hydrochlorothiazide  25 mg Oral Daily  . levothyroxine  175 mcg Oral QAC breakfast  . losartan  100 mg Oral Daily  . metoprolol  50 mg Oral BID  . simvastatin  20 mg Oral q1800  . sodium chloride  3 mL Intravenous Q12H  . tiotropium  18 mcg Inhalation Daily    Infusions: . sodium chloride 10 mL/hr at 05/08/13 0121  . heparin 1,200 Units/hr (05/08/13 0813)    PRN Medications: acetaminophen, acetaminophen, albuterol, HYDROcodone-acetaminophen, morphine injection, ondansetron (ZOFRAN) IV, ondansetron   Past Medical History  Diagnosis Date  . Hypertension   . CAD (coronary artery disease), autologous vein bypass graft     Cath 1.No aortic stenosis on pullback,   . COPD (chronic obstructive pulmonary disease)   . Thyroid disease   . Hypercholesterolemia   . Tobacco abuse   . Obesity     Past Surgical  History  Procedure Laterality Date  . Coronary artery bypass graft      Family History  Problem Relation Age of Onset  . Pancreatic cancer Mother     Social History Mr. Allums reports that he has been smoking Cigarettes.  He has a 14.75 pack-year smoking history. He does not have any smokeless tobacco history on file. Mr. Villarruel reports that he does not drink alcohol.  Review of Systems Otherwise reviewed and negative except as outlined.  Physical Examination Blood pressure 105/67, pulse 57,  temperature 97.8 F (36.6 C), temperature source Oral, resp. rate 18, height 5' 11.5" (1.816 m), weight 229 lb 6.4 oz (104.055 kg), SpO2 91.00%.  Intake/Output Summary (Last 24 hours) at 05/08/13 1331 Last data filed at 05/08/13 1006  Gross per 24 hour  Intake      0 ml  Output      0 ml  Net      0 ml    Telemetry: NSR bradycardia  HEENT: Conjunctiva and lids normal, oropharynx clear with moist mucosa. Neck: Supple, no elevated JVP or carotid bruits, no thyromegaly. Lungs: Clear to auscultation, nonlabored breathing at rest. Cardiac: Regular rate and rhythm, no S3 or significant systolic murmur, no pericardial rub. Abdomen: Soft, nontender, no hepatomegaly, bowel sounds present, no guarding or rebound. Extremities: No pitting edema, distal pulses 2+. Skin: Warm and dry. Musculoskeletal: No kyphosis. Neuropsychiatric: Alert and oriented x3, affect grossly appropriate.  Prior Cardiac Testing/Procedures CABG 2006  Cardiac Cath 2006 1.No aortic stenosis on pullback,  2.LIMA large vessel with angiography normal. Abdominal aorta with moderate plaquing without significant stenosis. No abdominal aortic aneurysm. 3. Renal arteries single vessels bilaterally both are normal. Common iliac arteries are normal.  4.Left main distal 40% stenosis LAD the vessel is diffusely diseased giving rise to a single large diagonal.  5.The proximal LAD has up to 90% stenosis. There is a 70% stenosis in the mid vessel after a large septal perforator.  The single diagonal has a 70% ostial stenosis.  6. Ramus intermediate moderate size vessel a 50% stenosis.  7. Circumflex moderate size vessel giving rise to 2 obtuse marginals,  The proximal vessel has a 20-30% stenosis. The first marginal has a long 99% stenosis. The distal AV groove circumflex has a focal 99% stenosis before the takeoff of the second marginal.  8. Right coronary artery is occluded proximally. There are right to right collaterals to the acute  marginal. There are collaterals to the PLB be a circumflex and PDA in the left anterior descending artery. There is a 95% stenosis at the bifurcation of the PDA and PLV.  Echocardiogram: 2006 #1. The left ventricle is now well visualized  #2. Left ventricular size was normal  #3. Overall left ventricle systolic function was at lower limits of normal.  #4 Left ventricular ejection fraction estimated 50% to 55%. #6 LV wall thickness was normal #5. Valve was not well visualized, there was no evidence for aortic valve stenosis, there was mild aortic valvular regurgitation.   Lab Results  Basic Metabolic Panel:  Recent Labs Lab 05/07/13 2202 05/08/13 0625  NA 139 142  K 3.9 3.8  CL 99 102  CO2 28 28  GLUCOSE 235* 105*  BUN 14 16  CREATININE 1.10 1.30  CALCIUM 9.3 8.9    Liver Function Tests:  Recent Labs Lab 05/07/13 2202 05/08/13 0625  AST 50* 44*  ALT 62* 52  ALKPHOS 77 72  BILITOT 0.4 0.5  PROT 7.0 6.4  ALBUMIN  3.5 3.3*    CBC:  Recent Labs Lab 05/07/13 2202 05/08/13 0625  WBC 6.5 7.0  NEUTROABS 3.4  --   HGB 16.4 16.0  HCT 46.1 46.0  MCV 81.6 81.4  PLT 122* 124*    Cardiac Enzymes:  Recent Labs Lab 05/07/13 2202 05/08/13 0102 05/08/13 0627 05/08/13 0730 05/08/13 1239  CKTOTAL  --   --   --  112  --   CKMB  --   --   --  PENDING  --   TROPONINI <0.30 <0.30 1.09*  --  0.97*     Radiology: Dg Chest 2 View  05/07/2013   CLINICAL DATA:  Chest pain and shortness of breath  EXAM: CHEST  2 VIEW  COMPARISON:  09/21/2012  FINDINGS: Cardiomegaly and CABG changes noted.  COPD/emphysema changes noted.  There is no evidence of focal airspace disease, pulmonary edema, suspicious pulmonary nodule/mass, pleural effusion, or pneumothorax. No acute bony abnormalities are identified.  IMPRESSION: No evidence of acute cardiopulmonary disease.  Cardiomegaly and COPD.   Electronically Signed   By: Hassan Rowan M.D.   On: 05/07/2013 22:30     ECG: NSR with T-wave  abnormalities in the infero/lateral leads. Unchanged from previous EKG in 09/2012.    Impression and Recommendations  1. NTEMI: With unstable angina. Known history of CAD with CABG in 2006. Records are requested from [facility] via fax, as they are on CD and not in EMR. He is currently pain free. Troponin elevation on 3rd set positive 1.09 without acute ST changes compared to previous EKG in July 2014. He is currently pain free, but has multiple ongoing risk factors.  He may benefit from cardiac cath with positive troponin and history of CABG 9 years ago with ongoing smoking. Continue ASA and Heparin. Cycle troponin.   Will discuss with Dr. Bronson Ing.   2. CAD: Records are requested from [facility] hospital for info on CABG and cath in 2006. Patient is a poor historian and does not have recollection of his prior CABG or cath details. He has been lost to follow up since 2006. Continue ASA. As stated, cardiac catheterization completed 2006 per Dr. Albertine Patricia, demonstrated three-vessel coronary artery disease with subsequent coronary artery bypass grafting.  3. Hypertension:  BP is low, and antihypertensive medications have been held this am. They include, HCTZ, amlodipine,losartan, and metoprolol. Will restart low dose BB only at this time.   4. Hypercholesterolemia: Fasting lipids and LFTs  5. Ongoing tobacco abuse: He is trying to cut down, but has smoked since the age of 28. Cessation is strongly recommended.  6. COPD: Followed by PCP.     Signed: Phill Myron. Purcell Nails NP [facility] 05/08/2013, 1:31 PM Co-Sign MD

## 2013-05-08 NOTE — Progress Notes (Signed)
*  PRELIMINARY RESULTS* Echocardiogram 2D Echocardiogram has been performed.  [place], [place] 05/08/2013, 2:26 PM

## 2013-05-08 NOTE — Progress Notes (Addendum)
ANTICOAGULATION CONSULT NOTE - Initial Consult  Pharmacy Consult for Heparin  Indication: chest pain/ACS  No Known Allergies  Patient Measurements: Height: 5' 11.5" (181.6 cm) Weight: 229 lb 6.4 oz (104.055 kg) IBW/kg (Calculated) : 76.45 Heparin Dosing Weight: 98 kg  Vital Signs: Temp: 97.8 F (36.6 C) (02/26 0416) Temp src: Oral (02/26 0416) BP: 80/42 mmHg (02/26 0509) Pulse Rate: 61 (02/26 0416)  Labs:  Recent Labs  05/07/13 2202 05/08/13 0102 05/08/13 0625 05/08/13 0627 05/08/13 0730  HGB 16.4  --  16.0  --   --   HCT 46.1  --  46.0  --   --   PLT 122*  --  124*  --   --   CREATININE 1.10  --  1.30  --   --   CKTOTAL  --   --   --   --  112  CKMB  --   --   --   --  PENDING  TROPONINI <0.30 <0.30  --  1.09*  --     Estimated Creatinine Clearance: 66.4 ml/min (by C-G formula based on Cr of 1.3).   Medical History: Past Medical History  Diagnosis Date  . Hypertension   . CAD (coronary artery disease), autologous vein bypass graft     s/p CABG  . COPD (chronic obstructive pulmonary disease)     Medications:  Scheduled:  . amLODipine  2.5 mg Oral Daily  . aspirin  325 mg Oral Daily  . enoxaparin (LOVENOX) injection  40 mg Subcutaneous Q24H  . hydrochlorothiazide  25 mg Oral Daily  . levothyroxine  175 mcg Oral QAC breakfast  . losartan  100 mg Oral Daily  . metoprolol  50 mg Oral BID  . simvastatin  20 mg Oral q1800  . sodium chloride  3 mL Intravenous Q12H  . tiotropium  18 mcg Inhalation Daily    Assessment: 68 yo M with hx CAD s/p CABG who presented with chest pain.  Troponin is now elevated.  No bleeding noted.  Low baseline pltc that is stable.    Goal of Therapy:  Heparin level 0.3-0.7 units/ml Monitor platelets by anticoagulation protocol: Yes   Plan:  Give 4000 units bolus x 1 Start heparin infusion at 1200 units/hr Check anti-Xa level in 6 hours and daily while on heparin Continue to monitor H&H and platelets  Adrean Heitz, Lavonia Drafts 05/08/2013,7:41 AM  Initial heparin level subtherapeutic.  Rate increased 4 units/kg/hr.  Recheck 6hr heparin level.   Noted cardiology plans to stop heparin 2/27 am.   Netta Cedars, PharmD, BCPS 05/08/2013@4 :10 PM

## 2013-05-08 NOTE — Progress Notes (Signed)
CRITICAL VALUE ALERT  Critical value received:  Triponin  Date of notification:  05/08/2013  Time of notification:  0715  Critical value read back:yes  Nurse who received alert:  Miquel Dunn  MD notified (1st page):  Memon  Time of first page:  0720  MD notified (2nd page):  Time of second page:  Responding MD:  Roderic Palau  Time MD responded:  [phone]

## 2013-05-08 NOTE — Progress Notes (Signed)
UR completed 

## 2013-05-09 LAB — COMPREHENSIVE METABOLIC PANEL
ALK PHOS: 73 U/L (ref 39–117)
ALT: 56 U/L — ABNORMAL HIGH (ref 0–53)
AST: 54 U/L — ABNORMAL HIGH (ref 0–37)
Albumin: 3.3 g/dL — ABNORMAL LOW (ref 3.5–5.2)
BUN: 18 mg/dL (ref 6–23)
CO2: 26 meq/L (ref 19–32)
CREATININE: 1.25 mg/dL (ref 0.50–1.35)
Calcium: 9 mg/dL (ref 8.4–10.5)
Chloride: 102 mEq/L (ref 96–112)
GFR calc Af Amer: 66 mL/min — ABNORMAL LOW (ref >90)
GFR, EST NON AFRICAN AMERICAN: 57 mL/min — AB (ref >90)
Glucose, Bld: 114 mg/dL — ABNORMAL HIGH (ref 70–99)
POTASSIUM: 4 meq/L (ref 3.7–5.3)
Sodium: 139 mEq/L (ref 137–147)
Total Bilirubin: 0.6 mg/dL (ref 0.3–1.2)
Total Protein: 6.5 g/dL (ref 6.0–8.3)

## 2013-05-09 LAB — CBC
HEMATOCRIT: 47.5 % (ref 39.0–52.0)
Hemoglobin: 16.5 g/dL (ref 13.0–17.0)
MCH: 28.4 pg (ref 26.0–34.0)
MCHC: 34.7 g/dL (ref 30.0–36.0)
MCV: 81.9 fL (ref 78.0–100.0)
Platelets: 112 10*3/uL — ABNORMAL LOW (ref 150–400)
RBC: 5.8 MIL/uL (ref 4.22–5.81)
RDW: 13.8 % (ref 11.5–15.5)
WBC: 7.8 10*3/uL (ref 4.0–10.5)

## 2013-05-09 MED ORDER — METOPROLOL TARTRATE 50 MG PO TABS: 25.0000 mg | ORAL_TABLET | Freq: Two times a day (BID) | ORAL | Status: DC

## 2013-05-09 MED ORDER — HYDROCHLOROTHIAZIDE 12.5 MG PO CAPS
12.5000 mg | ORAL_CAPSULE | Freq: Every day | ORAL | Status: DC
  Administered 2013-05-09: 12.5 mg via ORAL
  Filled 2013-05-09: qty 1

## 2013-05-09 MED ORDER — LOSARTAN POTASSIUM 50 MG PO TABS
25.0000 mg | ORAL_TABLET | Freq: Every day | ORAL | Status: DC
  Administered 2013-05-09: 25 mg via ORAL
  Filled 2013-05-09: qty 1

## 2013-05-09 MED ORDER — ASPIRIN 325 MG PO TABS: 325.0000 mg | ORAL_TABLET | Freq: Every day | ORAL | Status: DC

## 2013-05-09 MED ORDER — CLOPIDOGREL BISULFATE 75 MG PO TABS: 75.0000 mg | ORAL_TABLET | Freq: Every day | ORAL | Status: DC

## 2013-05-09 MED ORDER — ATORVASTATIN CALCIUM 40 MG PO TABS: 40.0000 mg | ORAL_TABLET | Freq: Every day | ORAL | Status: DC

## 2013-05-09 MED ORDER — SIMVASTATIN 20 MG PO TABS: 40.0000 mg | ORAL_TABLET | Freq: Every day | ORAL | Status: DC

## 2013-05-09 NOTE — Progress Notes (Signed)
Consulting cardiologist: Bronson Ing  Subjective:    No more chest pain. He is still anxious to go home.   Objective:   Temp:  [97.7 F (36.5 C)-98.2 F (36.8 C)] 97.7 F (36.5 C) (02/27 0548) Pulse Rate:  [51-58] 51 (02/27 0548) Resp:  [18] 18 (02/27 0548) BP: (84-142)/(48-67) 84/48 mmHg (02/27 0548) SpO2:  [91 %-96 %] 93 % (02/27 0848) Last BM Date: 05/07/13  Filed Weights   05/08/13 0054  Weight: 229 lb 6.4 oz (104.055 kg)    Intake/Output Summary (Last 24 hours) at 05/09/13 0941 Last data filed at 05/08/13 1801  Gross per 24 hour  Intake    270 ml  Output      0 ml  Net    270 ml    Telemetry: NSR  Exam:  General: No acute distress.  HEENT: Conjunctiva and lids normal, oropharynx clear.  Lungs: Clear to auscultation, nonlabored.  Cardiac: No elevated JVP or bruits. RRR, no gallop or rub.   Abdomen: Normoactive bowel sounds, nontender, obese, nondistended.  Extremities: No pitting edema, distal pulses full.  Neuropsychiatric: Alert and oriented x3, affect appropriate.   Lab Results:  Basic Metabolic Panel:  Recent Labs Lab 05/07/13 2202 05/08/13 0625 05/09/13 0552  NA 139 142 139  K 3.9 3.8 4.0  CL 99 102 102  CO2 28 28 26   GLUCOSE 235* 105* 114*  BUN 14 16 18   CREATININE 1.10 1.30 1.25  CALCIUM 9.3 8.9 9.0    Liver Function Tests:  Recent Labs Lab 05/07/13 2202 05/08/13 0625 05/09/13 0552  AST 50* 44* 54*  ALT 62* 52 56*  ALKPHOS 77 72 73  BILITOT 0.4 0.5 0.6  PROT 7.0 6.4 6.5  ALBUMIN 3.5 3.3* 3.3*    CBC:  Recent Labs Lab 05/07/13 2202 05/08/13 0625 05/09/13 0552  WBC 6.5 7.0 7.8  HGB 16.4 16.0 16.5  HCT 46.1 46.0 47.5  MCV 81.6 81.4 81.9  PLT 122* 124* 112*    Cardiac Enzymes:  Recent Labs Lab 05/08/13 0102 05/08/13 0627 05/08/13 0730 05/08/13 1239  CKTOTAL  --   --  112  --   CKMB  --   --  6.2*  --   TROPONINI <0.30 1.09*  --  0.97*    Coagulation:  Recent Labs Lab 05/08/13 0625  INR 1.13      Radiology: Dg Chest 2 View  05/07/2013   CLINICAL DATA:  Chest pain and shortness of breath  EXAM: CHEST  2 VIEW  COMPARISON:  09/21/2012  FINDINGS: Cardiomegaly and CABG changes noted.  COPD/emphysema changes noted.  There is no evidence of focal airspace disease, pulmonary edema, suspicious pulmonary nodule/mass, pleural effusion, or pneumothorax. No acute bony abnormalities are identified.  IMPRESSION: No evidence of acute cardiopulmonary disease.  Cardiomegaly and COPD.   Electronically Signed   By: Hassan Rowan M.D.   On: 05/07/2013 22:30   Echocardiogram: Procedure narrative: Transthoracic echocardiography. Image quality was suboptimal. The study was technically difficult, as a result of poor sound wave transmission and body habitus. - Left ventricle: The cavity size was normal. Wall thickness was increased in a pattern of severe LVH. Systolic function was normal. The estimated ejection fraction was in the range of 60% to 65%. Wall motion was normal; there were no regional wall motion abnormalities. Features are consistent with a pseudonormal left ventricular filling pattern, with concomitant abnormal relaxation and increased filling pressure (grade 2 diastolic dysfunction). Doppler parameters are consistent with high ventricular filling pressure. -  Aortic valve: Mildly calcified annulus. Mildly thickened, mildly calcified leaflets. Trivial regurgitation. - Mitral valve: Calcified annulus. Mildly thickened leaflets - Left atrium: The atrium was mildly dilated.     Medications:   Scheduled Medications: . amLODipine  2.5 mg Oral Daily  . aspirin  325 mg Oral Daily  . atorvastatin  40 mg Oral q1800  . hydrochlorothiazide  25 mg Oral Daily  . levothyroxine  175 mcg Oral QAC breakfast  . losartan  100 mg Oral Daily  . metoprolol tartrate  25 mg Oral BID  . sodium chloride  3 mL Intravenous Q12H  . tiotropium  18 mcg Inhalation Daily     Infusions: . sodium chloride 10  mL/hr at 05/08/13 2109  . heparin 1,750 Units/hr (05/08/13 2249)     PRN Medications:  acetaminophen, acetaminophen, albuterol, HYDROcodone-acetaminophen, morphine injection, ondansetron (ZOFRAN) IV, ondansetron   Assessment and Plan:   1. NSTEMI: Troponin trended down on follow up. He refuses to have invasive testing, RE: cath. Will d/c heparin drip, begin Plavix 75 mg daily.  He will continue ASA. Continue metoprolol. He will follow up in the [place] office for ongoing cardiac management.  2. CAD: 3 vessel CAD, S/P CABG in 2006. Not all records are available for details of graft sites. He has been lost to cardiac follow up since 2006. I have talked with him about coming back to cardiologist for follow up as outpt, and he is willing to do so, in [place]. He can go home from our standpoint.  3. Transaminitis: He has been taken off of simvastatin, and placed on atorvastatin. Will begin pravastatin instead with continued elevation in LFTs this am.   4. Hypotension: Discontinue amlodipine, decrease losartan to 25 md daily, and decrease HCTZ to 12.5 mg daily. Continue metoprolol as he is taking here. Close follow up with PCP and cardiology as OP.  Phill Myron. Purcell Nails NP [facility] 05/09/2013, 9:41 AM  The patient was seen and examined, and I agree with the assessment and plan as documented above, with modifications as noted below. He denies chest pain and is eager to go home. He has normal LV systolic function and severe LVH with grade II diastolic dysfunction. If he is discharged later today/tomorrow, I would recommend ASA 325 mg daily, Plavix 75 mg daily, pravastatin (Lipitor caused significant myalgias in past) 40 mg daily, and Toprol-XL 25 mg daily (if his HR is normal, then 25 mg bid of metoprolol tartrate would be fine). Antihypertensive doses have been reduced due to hypotension.

## 2013-05-09 NOTE — Discharge Summary (Signed)
Physician Discharge Summary  [name] ZWC:[id number] DOB: 1945/07/05 DOA: 05/07/2013  PCP: Sherrie Mustache, MD  Admit date: 05/07/2013 Discharge date: 05/09/2013  Time spent: 40 minutes  Recommendations for Outpatient Follow-up:  1. Follow up with cardiology as outpatient in [place], [place].  Appointment has been arranged.  Discharge Diagnoses:  Principal Problem:   Chest pain Active Problems:   Hx of CABG   HTN (hypertension)   CAD (coronary artery disease)   Transaminitis   NSTEMI (non-ST elevated myocardial infarction)   Unstable angina   Discharge Condition: improved  Diet recommendation: heart healthy  Filed Weights   05/08/13 0054  Weight: 104.055 kg (229 lb 6.4 oz)    History of present illness:  Randy Carpenter is a 68 y.o. male with a past medical history of coronary artery disease, status post CABG, hypertension, who was in his usual state of health about 9:15 PM tonight when he was watching TV and he started experiencing chest pain in the retrosternal area. There was a knife like sensation, 6/10 in intensity without any radiation. There was no aggravating or precipitating factor. He felt short of breath, slightly dizzy. Denies any syncopal episode. No nausea, no leg swelling recently. No symptoms suggestive of diaphoresis. No fever. No chills. He took 4 baby aspirin. He called EMS who gave him nitroglycerin tablet. He was given another nitroglycerin after which the pain appeared to subside. He is currently without any chest pain. Denies any acid reflux. He had similar pain about 5-6 months ago, which resolved after 10 minutes without intervention. He is not sure when he had his CABG. He had states that he has had a stress test since his CABG but unable to tell me the timeline on that either. Not a very good historian.  Hospital Course:  This patient was admitted to the hospital with chest pain. He ruled out for ACS with positive cardiac markers. The patient was  started on heparin infusion and was seen by cardiology. Recommendations were for transfer to [facility] for cardiac catheterization. Unfortunately, the patient adamantly declined any further workup. Echocardiogram was done which showed preserved ejection fraction and no wall motion abnormalities. The patient was continued on 48 hours of heparin and started on aspirin and Plavix. He is already on a statin, beta blocker. His ACE inhibitor was held due to borderline blood pressures. This could certainly be resumed in the outpatient setting. He has been arranged a followup appointment with cardiology. At the time of discharge, patient was chest pain-free and did not have any further complaints.  Procedures: Echo:- Procedure narrative: Transthoracic echocardiography. Image quality was suboptimal. The study was technically difficult, as a result of poor sound wave transmission and body habitus. - Left ventricle: The cavity size was normal. Wall thickness was increased in a pattern of severe LVH. Systolic function was normal. The estimated ejection fraction was in the range of 60% to 65%. Wall motion was normal; there were no regional wall motion abnormalities. Features are consistent with a pseudonormal left ventricular filling pattern, with concomitant abnormal relaxation and increased filling pressure (grade 2 diastolic dysfunction). Doppler parameters are consistent with high ventricular filling pressure. - Aortic valve: Mildly calcified annulus. Mildly thickened, mildly calcified leaflets. Trivial regurgitation. - Mitral valve: Calcified annulus. Mildly thickened leaflets . - Left atrium: The atrium was mildly dilated.     Consultations:  Cardiology  Discharge Exam: Filed Vitals:   05/09/13 1026  BP: 131/72  Pulse:   Temp:   Resp:  General: NAD Cardiovascular: S1, S2 RRR Respiratory: CTA B  Discharge Instructions  Discharge Orders   Future Appointments Provider  Department Dept Phone   05/28/2013 11:40 AM Herminio Commons, MD [place] [phone]   Future Orders Complete By Expires   Call MD for:  difficulty breathing, headache or visual disturbances  As directed    Call MD for:  persistant nausea and vomiting  As directed    Call MD for:  severe uncontrolled pain  As directed    Diet - low sodium heart healthy  As directed    Increase activity slowly  As directed        Medication List    STOP taking these medications       amLODipine 2.5 MG tablet  Commonly known as:  NORVASC     losartan-hydrochlorothiazide 100-25 MG per tablet  Commonly known as:  HYZAAR      TAKE these medications       aspirin 325 MG tablet  Take 1 tablet (325 mg total) by mouth daily.     clopidogrel 75 MG tablet  Commonly known as:  PLAVIX  Take 1 tablet (75 mg total) by mouth daily with breakfast.  Start taking on:  05/10/2013     fenofibrate 160 MG tablet  Take 160 mg by mouth daily after breakfast.     levothyroxine 175 MCG tablet  Commonly known as:  SYNTHROID, LEVOTHROID  Take 175 mcg by mouth daily before breakfast.     metoprolol 50 MG tablet  Commonly known as:  LOPRESSOR  Take 0.5 tablets (25 mg total) by mouth 2 (two) times daily. For high blood pressure     pravastatin 40 MG tablet  Commonly known as:  PRAVACHOL  Take 1 tablet (40 mg total) by mouth daily.     tiotropium 18 MCG inhalation capsule  Commonly known as:  SPIRIVA  Place 18 mcg into inhaler and inhale daily.       No Known Allergies     Follow-up Information   Follow up with Herminio Commons, MD On 05/28/2013. (11:40 am )    Specialty:  Cardiology   Contact information:   [address] [address] [phone]       Follow up with Sherrie Mustache, MD. Schedule an appointment as soon as possible for a visit in 2 weeks.   Specialty:  Family Medicine   Contact information:   [address] [place]  [address] [phone]        The results of significant diagnostics from this hospitalization (including imaging, microbiology, ancillary and laboratory) are listed below for reference.    Significant Diagnostic Studies: Dg Chest 2 View  05/07/2013   CLINICAL DATA:  Chest pain and shortness of breath  EXAM: CHEST  2 VIEW  COMPARISON:  09/21/2012  FINDINGS: Cardiomegaly and CABG changes noted.  COPD/emphysema changes noted.  There is no evidence of focal airspace disease, pulmonary edema, suspicious pulmonary nodule/mass, pleural effusion, or pneumothorax. No acute bony abnormalities are identified.  IMPRESSION: No evidence of acute cardiopulmonary disease.  Cardiomegaly and COPD.   Electronically Signed   By: Hassan Rowan M.D.   On: 05/07/2013 22:30    Microbiology: No results found for this or any previous visit (from the past 240 hour(s)).   Labs: Basic Metabolic Panel:  Recent Labs Lab 05/07/13 2202 05/08/13 0625 05/09/13 0552  NA 139 142 139  K 3.9 3.8 4.0  CL 99 102 102  CO2 28 28 26   GLUCOSE 235* 105* 114*  BUN 14 16 18   CREATININE 1.10 1.30 1.25  CALCIUM 9.3 8.9 9.0   Liver Function Tests:  Recent Labs Lab 05/07/13 2202 05/08/13 0625 05/09/13 0552  AST 50* 44* 54*  ALT 62* 52 56*  ALKPHOS 77 72 73  BILITOT 0.4 0.5 0.6  PROT 7.0 6.4 6.5  ALBUMIN 3.5 3.3* 3.3*   No results found for this basename: LIPASE, AMYLASE,  in the last 168 hours No results found for this basename: AMMONIA,  in the last 168 hours CBC:  Recent Labs Lab 05/07/13 2202 05/08/13 0625 05/09/13 0552  WBC 6.5 7.0 7.8  NEUTROABS 3.4  --   --   HGB 16.4 16.0 16.5  HCT 46.1 46.0 47.5  MCV 81.6 81.4 81.9  PLT 122* 124* 112*   Cardiac Enzymes:  Recent Labs Lab 05/07/13 2202 05/08/13 0102 05/08/13 0627 05/08/13 0730 05/08/13 1239  CKTOTAL  --   --   --  112  --   CKMB  --   --   --  6.2*  --   TROPONINI <0.30 <0.30 1.09*  --  0.97*   BNP: BNP (last 3 results) No results found for  this basename: PROBNP,  in the last 8760 hours CBG: No results found for this basename: GLUCAP,  in the last 168 hours     Signed:  MEMON,JEHANZEB  [facility] 05/09/2013, 1:30 PM

## 2013-05-09 NOTE — Progress Notes (Signed)
Patient with orders to be discharge home. Discharge instructions given, patient verbalized understanding. Prescriptions given. Patient stable. Patient left in private vehicle with friends.

## 2013-05-09 NOTE — Discharge Instructions (Signed)
Myocardial Infarction  A myocardial infarction (MI) is damage to the heart that is not reversible. It is also called a heart attack. An MI usually occurs when a heart (coronary) artery becomes blocked or narrowed. This cuts off the blood supply to the heart. When one or more of the heart (coronary) arteries becomes blocked, that area of the heart begins to die. This causes pain felt during an MI.   If you think you might be having an MI, call your local emergency services immediately (911 in [place] It is recommended that you take a 162 mg non-enteric coated aspirin if you do not have an aspirin allergy. Do not drive yourself to the hospital or wait to see if your symptoms go away. The sooner MI is treated, the greater the amount of heart muscle saved. Time is muscle. It can save your life.  CAUSES   An MI can occur from:  · A gradual buildup of a fatty substance called plaque. When plaque builds up in the arteries, this condition is called atherosclerosis. This buildup can block or reduce the blood supply to the heart artery(s).  · A sudden plaque rupture within a heart artery that causes a blood clot (thrombus). A blood clot can block the heart artery which does not allow blood flow to the heart.  · A severe tightening (spasm) of the heart artery. This is a less common cause of a heart attack. When a heart artery spasms, it cuts off blood flow through the artery. Spasms can occur in heart arteries that do not have atherosclerosis.  RISK FACTORS  People at risk for an MI usually have one or more risk factors, such as:  · High blood pressure.  · High cholesterol.  · Smoking.  · Gender. Men have a higher heart attack risk.  · Overweight/obesity.  · Age.  · Family history.  · Lack of exercise.  · Diabetes.  · Stress.  · Excessive alcohol use.  · Street drug use (cocaine and methamphetamines).  SYMPTOMS   MI symptoms can vary, such as:  · In both men and women, MI symptoms can include the following:  · Chest pain. The  chest pain may feel like a crushing, squeezing, or "pressure" type feeling. MI pain can be "referred," meaning pain can be caused in one part of the body but felt in another part of the body. Referred MI pain may occur in the left arm, neck, or jaw. Pain may even be felt in the right arm.  · Shortness of breath (dyspnea).  · Heartburn or indigestion with or without vomiting, shortness of breath, or sweating (diaphoresis).  · Sudden, cold sweats.  · Sudden lightheadedness.  · Upper back pain.  · Women can have unique MI symptoms, such as:  · Unexplained feelings of nervousness or anxiety.  · Discomfort between the shoulder blades (scapula) or upper back.  · Tingling in the hands and arms.  · In elderly people (regardless of gender), MI symptoms can be subtle, such as:  · Sweating (diaphoresis).  · Shortness of breath (dyspnea).  · General tiredness (fatigue) or not feeling well (malaise).  DIAGNOSIS   Diagnosis of an MI involves several tests such as:  · An assessment of your vital signs such as heart rhythm, blood pressure, respiratory rate, and oxygen level.  · An EKG (ECG) to look at the electrical activity of your heart.  · Blood tests called cardiac markers are drawn at scheduled times to measure proteins or   enzymes released by the damaged heart muscle.  · A chest X-ray.  · An echocardiogram to evaluate heart motion and blood flow.  · Coronary angiography (cardiac catheterization). This is a diagnostic procedure to look at the heart arteries.  TREATMENT   Acute Intervention. For an MI, the national standard in the [place] is to have an acute intervention in under 90 minutes from the time you get to the hospital. An acute intervention is a special procedure to open up the heart arteries. It is done in a treatment room called a "catheterization lab" (cath lab). Some hospitals do no have a cath lab. If you are having an MI and the hospital does not have a cath lab, the standard is to transport you to a  hospital that has one. In the cath lab, acute intervention includes:  · Angioplasty. An angioplasty involves inserting a thin, flexible tube (catheter) into an artery in either your groin or wrist. The catheter is threaded to the heart arteries. A balloon at the end of the catheter is inflated to open a narrowed or blocked heart artery. During an angioplasty procedure, a small mesh tube (stent) may be used to keep the heart artery open. Depending on your condition and health history, one of two types of stents may be placed:  · Drug-eluting stent (DES). A DES is coated with a medicine to prevent scar tissue from growing over the stent. With drug-eluting stents, blood thinning medicine will need to be taken for up to a year.  · Bare metal stent. This type of stent has no special coating to keep tissue from growing over it. This type of stent is used if you cannot take blood thinning medicine for a prolonged time or you need surgery in the near future. After a bare metal stent is placed, blood thinning medicine will need to be taken for about a month.  · If you are taking blood thinning medicine (anti-platelet therapy) after stent placement, do not stop taking it unless your caregiver says it is okay to do so. Make sure you understand how long you need to take the medicine.  Surgical Intervention  · If an acute intervention is not successful, surgery may be needed:  · Open heart surgery (coronary artery bypass graft, CABG). CABG takes a vein (saphenous vein) from your leg. The vein is then attached to the blocked heart artery which bypasses the blockage. This then allows blood flow to the heart muscle.  Additional Interventions  · A "clot buster" medicine (thrombolytic) may be given. This medicine can help break up a clot in the heart artery. This medicine may be given if a person cannot get to a cath lab right away.  · Intra-aortic balloon pump (IABP). If you have suffered a very severe MI and are too unstable to go  to the cath lab or to surgery, an IABP may be used. This is a temporary mechanical device used to increase blood flow to the heart and reduce the workload of the heart until you are stable enough to go to the cath lab or surgery.  HOME CARE INSTRUCTIONS  After an MI, you may need the following:  · Medicine. Take medicine as directed by your caregiver. Medicines after an MI may:  · Keep your blood from clotting easily (blood thinners).  · Control your blood pressure.  · Help lower your cholesterol.  · Control abnormal heart rhythms.  · Lifestyle changes. Under the guidance of your caregiver, lifestyle changes   include:  · Quitting smoking, if you smoke. Your caregiver can help you quit.  · Being physically active.  · Maintaining a healthy weight.  · Eating a heart healthy diet. A dietitian can help you learn healthy eating options.  · Managing diabetes.  · Reducing stress.  · Limiting alcohol intake.  SEEK IMMEDIATE MEDICAL CARE IF:   · You have severe chest pain, especially if the pain is crushing or pressure-like and spreads to the arms, back, neck, or jaw. This is an emergency. Do not wait to see if the pain will go away. Get medical help at once. Call your local emergency services (911 in the [place] Do not drive yourself to the hospital.  · You have shortness of breath during rest, sleep, or with activity.  · You have sudden sweating or clammy skin.  · You feel sick to your stomach (nauseous) and throw up (vomit).  · You suddenly become lightheaded or dizzy.  · You feel your heart beating rapidly or you notice "skipped" beats.  MAKE SURE YOU:   · Understand these instructions.  · Will watch your condition.  · Will get help right away if you are not doing well or get worse.  Document Released: 02/27/2005 Document Revised: 11/22/2011 Document Reviewed: 08/02/2010  [employer]® Patient Information ©2014 [employer].

## 2013-05-28 ENCOUNTER — Encounter: Payer: Medicare Other | Admitting: Cardiovascular Disease

## 2014-06-19 ENCOUNTER — Observation Stay (HOSPITAL_COMMUNITY)
Admission: EM | Admit: 2014-06-19 | Discharge: 2014-06-20 | Disposition: A | Discharge status: Home / Self Care | Payer: Medicare Other | Attending: Internal Medicine | Admitting: Internal Medicine

## 2014-06-19 ENCOUNTER — Emergency Department (HOSPITAL_COMMUNITY): Payer: Medicare Other

## 2014-06-19 ENCOUNTER — Encounter (HOSPITAL_COMMUNITY): Payer: Self-pay | Admitting: Emergency Medicine

## 2014-06-19 DIAGNOSIS — E079 Disorder of thyroid, unspecified: Secondary | ICD-10-CM | POA: Diagnosis not present

## 2014-06-19 DIAGNOSIS — R05 Cough: Secondary | ICD-10-CM | POA: Diagnosis not present

## 2014-06-19 DIAGNOSIS — R079 Chest pain, unspecified: Principal | ICD-10-CM | POA: Diagnosis present

## 2014-06-19 DIAGNOSIS — Z951 Presence of aortocoronary bypass graft: Secondary | ICD-10-CM | POA: Diagnosis not present

## 2014-06-19 DIAGNOSIS — E780 Pure hypercholesterolemia: Secondary | ICD-10-CM | POA: Insufficient documentation

## 2014-06-19 DIAGNOSIS — I25810 Atherosclerosis of coronary artery bypass graft(s) without angina pectoris: Secondary | ICD-10-CM | POA: Insufficient documentation

## 2014-06-19 DIAGNOSIS — I2510 Atherosclerotic heart disease of native coronary artery without angina pectoris: Secondary | ICD-10-CM | POA: Diagnosis present

## 2014-06-19 DIAGNOSIS — I10 Essential (primary) hypertension: Secondary | ICD-10-CM | POA: Diagnosis present

## 2014-06-19 DIAGNOSIS — Z720 Tobacco use: Secondary | ICD-10-CM | POA: Diagnosis not present

## 2014-06-19 DIAGNOSIS — Z79899 Other long term (current) drug therapy: Secondary | ICD-10-CM | POA: Diagnosis not present

## 2014-06-19 DIAGNOSIS — Z7902 Long term (current) use of antithrombotics/antiplatelets: Secondary | ICD-10-CM | POA: Diagnosis not present

## 2014-06-19 DIAGNOSIS — E669 Obesity, unspecified: Secondary | ICD-10-CM | POA: Diagnosis not present

## 2014-06-19 DIAGNOSIS — Z7982 Long term (current) use of aspirin: Secondary | ICD-10-CM | POA: Insufficient documentation

## 2014-06-19 LAB — CBC WITH DIFFERENTIAL/PLATELET
BASOS ABS: 0.1 10*3/uL (ref 0.0–0.1)
Basophils Relative: 1 % (ref 0–1)
Eosinophils Absolute: 0.2 10*3/uL (ref 0.0–0.7)
Eosinophils Relative: 3 % (ref 0–5)
HCT: 45.5 % (ref 39.0–52.0)
Hemoglobin: 16.2 g/dL (ref 13.0–17.0)
Lymphocytes Relative: 39 % (ref 12–46)
Lymphs Abs: 3 10*3/uL (ref 0.7–4.0)
MCH: 28.9 pg (ref 26.0–34.0)
MCHC: 35.6 g/dL (ref 30.0–36.0)
MCV: 81.1 fL (ref 78.0–100.0)
Monocytes Absolute: 0.6 10*3/uL (ref 0.1–1.0)
Monocytes Relative: 7 % (ref 3–12)
Neutro Abs: 3.8 10*3/uL (ref 1.7–7.7)
Neutrophils Relative %: 51 % (ref 43–77)
PLATELETS: 97 10*3/uL — AB (ref 150–400)
RBC: 5.61 MIL/uL (ref 4.22–5.81)
RDW: 13.7 % (ref 11.5–15.5)
WBC: 7.6 10*3/uL (ref 4.0–10.5)

## 2014-06-19 LAB — COMPREHENSIVE METABOLIC PANEL
ALT: 52 U/L (ref 0–53)
AST: 44 U/L — AB (ref 0–37)
Albumin: 3.7 g/dL (ref 3.5–5.2)
Alkaline Phosphatase: 94 U/L (ref 39–117)
Anion gap: 9 (ref 5–15)
BILIRUBIN TOTAL: 0.5 mg/dL (ref 0.3–1.2)
BUN: 12 mg/dL (ref 6–23)
CHLORIDE: 102 mmol/L (ref 96–112)
CO2: 23 mmol/L (ref 19–32)
Calcium: 8.8 mg/dL (ref 8.4–10.5)
Creatinine, Ser: 0.91 mg/dL (ref 0.50–1.35)
GFR calc Af Amer: >90 mL/min (ref >90)
GFR, EST NON AFRICAN AMERICAN: 84 mL/min — AB (ref >90)
Glucose, Bld: 248 mg/dL — ABNORMAL HIGH (ref 70–99)
POTASSIUM: 3.6 mmol/L (ref 3.5–5.1)
Sodium: 134 mmol/L — ABNORMAL LOW (ref 135–145)
Total Protein: 6.7 g/dL (ref 6.0–8.3)

## 2014-06-19 LAB — TROPONIN I: Troponin I: <0.03 ng/mL (ref <0.031)

## 2014-06-19 LAB — BRAIN NATRIURETIC PEPTIDE: B Natriuretic Peptide: 29 pg/mL (ref 0.0–100.0)

## 2014-06-19 MED ORDER — LEVOTHYROXINE SODIUM 50 MCG PO TABS: 150.0000 ug | ORAL_TABLET | Freq: Every day | ORAL | Status: DC

## 2014-06-19 MED ORDER — HYDROCHLOROTHIAZIDE 25 MG PO TABS
25.0000 mg | ORAL_TABLET | Freq: Every day | ORAL | Status: DC
  Filled 2014-06-19: qty 1

## 2014-06-19 MED ORDER — SODIUM CHLORIDE 0.9 % IV SOLN
INTRAVENOUS | Status: DC
  Administered 2014-06-20: via INTRAVENOUS

## 2014-06-19 MED ORDER — LOSARTAN POTASSIUM-HCTZ 100-25 MG PO TABS: 1.0000 | ORAL_TABLET | Freq: Every day | ORAL | Status: DC

## 2014-06-19 MED ORDER — AMLODIPINE BESYLATE 5 MG PO TABS
5.0000 mg | ORAL_TABLET | Freq: Every day | ORAL | Status: DC
  Filled 2014-06-19: qty 1

## 2014-06-19 MED ORDER — PRAVASTATIN SODIUM 40 MG PO TABS: 40.0000 mg | ORAL_TABLET | Freq: Every day | ORAL | Status: DC

## 2014-06-19 MED ORDER — LEVOTHYROXINE SODIUM 75 MCG PO TABS
175.0000 ug | ORAL_TABLET | Freq: Every day | ORAL | Status: DC
  Filled 2014-06-19 (×2): qty 1

## 2014-06-19 MED ORDER — METOPROLOL TARTRATE 25 MG PO TABS
25.0000 mg | ORAL_TABLET | Freq: Two times a day (BID) | ORAL | Status: DC
  Administered 2014-06-20: 25 mg via ORAL
  Filled 2014-06-19 (×2): qty 1

## 2014-06-19 MED ORDER — HEPARIN SODIUM (PORCINE) 5000 UNIT/ML IJ SOLN
5000.0000 [IU] | Freq: Three times a day (TID) | INTRAMUSCULAR | Status: DC
  Filled 2014-06-19: qty 1

## 2014-06-19 MED ORDER — LOSARTAN POTASSIUM 50 MG PO TABS
100.0000 mg | ORAL_TABLET | Freq: Every day | ORAL | Status: DC
  Filled 2014-06-19: qty 2

## 2014-06-19 MED ORDER — ACETAMINOPHEN 325 MG PO TABS: 650.0000 mg | ORAL_TABLET | ORAL | Status: DC | PRN

## 2014-06-19 MED ORDER — ASPIRIN 325 MG PO TABS
325.0000 mg | ORAL_TABLET | Freq: Every day | ORAL | Status: DC
  Filled 2014-06-19: qty 1

## 2014-06-19 MED ORDER — ONDANSETRON HCL 4 MG/2ML IJ SOLN: 4.0000 mg | Freq: Four times a day (QID) | INTRAMUSCULAR | Status: DC | PRN

## 2014-06-19 NOTE — ED Provider Notes (Signed)
CSN: [id number]     Arrival date & time 06/19/14  1925 History   First MD Initiated Contact with Patient 06/19/14 1933     Chief Complaint  Patient presents with  . Chest Pain     (Consider location/radiation/quality/duration/timing/severity/associated sxs/prior Treatment) Patient is a 69 y.o. male presenting with chest pain. The history is provided by the patient.  Chest Pain Pain location:  L chest Pain quality: aching   Pain radiates to:  L arm Pain radiates to the back: no   Pain severity:  Moderate Onset quality:  Sudden Duration:  3 days Timing:  Intermittent Progression:  Waxing and waning Chronicity:  Recurrent Relieved by:  Nothing Worsened by:  Coughing Ineffective treatments:  Aspirin Associated symptoms: cough   Associated symptoms: no fever and not vomiting     Past Medical History  Diagnosis Date  . Hypertension   . CAD (coronary artery disease), autologous vein bypass graft     Cardiac Cath 20061.No aortic stenosis on pullback,LIMA large vessel with angiography normal. Abdominal aorta with moderate plaquing without significant stenosis. No abdominal aortic aneurysm.Left main distal 40% stenosis LAD the vessel is diffusely diseased giving rise to a single large diagonal. The proximal LAD has up to 90% stenosis. There is a 70% stenosis in the mid vessel after a large sept   . COPD (chronic obstructive pulmonary disease)   . Thyroid disease   . Hypercholesterolemia   . Tobacco abuse   . Obesity    Past Surgical History  Procedure Laterality Date  . Coronary artery bypass graft     Family History  Problem Relation Age of Onset  . Pancreatic cancer Mother    History  Substance Use Topics  . Smoking status: Current Every Day Smoker -- 0.25 packs/day for 59 years    Types: Cigarettes  . Smokeless tobacco: Not on file  . Alcohol Use: No    Review of Systems  Constitutional: Negative for fever.  Respiratory: Positive for cough.   Cardiovascular:  Positive for chest pain.  Gastrointestinal: Negative for vomiting.  All other systems reviewed and are negative.     Allergies  Review of patient's allergies indicates no known allergies.  Home Medications   Prior to Admission medications   Medication Sig Start Date End Date Taking? Authorizing Provider  amLODipine (NORVASC) 5 MG tablet Take 5 mg by mouth daily.   Yes Historical Provider, MD  aspirin 325 MG tablet Take 1 tablet (325 mg total) by mouth daily. 05/09/13  Yes Kathie Dike, MD  levothyroxine (SYNTHROID, LEVOTHROID) 150 MCG tablet Take 150 mcg by mouth daily before breakfast.   Yes Historical Provider, MD  levothyroxine (SYNTHROID, LEVOTHROID) 175 MCG tablet Take 175 mcg by mouth daily before breakfast.   Yes Historical Provider, MD  losartan-hydrochlorothiazide (HYZAAR) 100-25 MG per tablet Take 1 tablet by mouth daily.   Yes Historical Provider, MD  metoprolol (LOPRESSOR) 50 MG tablet Take 0.5 tablets (25 mg total) by mouth 2 (two) times daily. For high blood pressure 05/09/13  Yes Kathie Dike, MD  pravastatin (PRAVACHOL) 40 MG tablet Take 1 tablet (40 mg total) by mouth daily. 09/21/12  Yes Hester Mates, MD  clopidogrel (PLAVIX) 75 MG tablet Take 1 tablet (75 mg total) by mouth daily with breakfast. Patient not taking: Reported on 06/19/2014 05/10/13   Kathie Dike, MD   BP 144/67 mmHg  Pulse 56  Temp(Src) 98.2 F (36.8 C)  Resp 18  Ht 5' 11.5" (1.816 m)  Wt 226  lb (102.513 kg)  BMI 31.08 kg/m2  SpO2 98% Physical Exam  Constitutional: He is oriented to person, place, and time. He appears well-developed and well-nourished.  Non-toxic appearance. No distress.  HENT:  Head: Normocephalic and atraumatic.  Eyes: Conjunctivae, EOM and lids are normal. Pupils are equal, round, and reactive to light.  Neck: Normal range of motion. Neck supple. No tracheal deviation present. No thyroid mass present.  Cardiovascular: Normal rate, regular rhythm and normal heart sounds.   Exam reveals no gallop.   No murmur heard. Pulmonary/Chest: Effort normal and breath sounds normal. No stridor. No respiratory distress. He has no decreased breath sounds. He has no wheezes. He has no rhonchi. He has no rales.  Abdominal: Soft. Normal appearance and bowel sounds are normal. He exhibits no distension. There is no tenderness. There is no rebound and no CVA tenderness.  Musculoskeletal: Normal range of motion. He exhibits no edema or tenderness.  Neurological: He is alert and oriented to person, place, and time. He has normal strength. No cranial nerve deficit or sensory deficit. GCS eye subscore is 4. GCS verbal subscore is 5. GCS motor subscore is 6.  Skin: Skin is warm and dry. No abrasion and no rash noted.  Psychiatric: He has a normal mood and affect. His speech is normal and behavior is normal.  Nursing note and vitals reviewed.   ED Course  Procedures (including critical care time) Labs Review Labs Reviewed  TROPONIN I  CBC WITH DIFFERENTIAL/PLATELET  COMPREHENSIVE METABOLIC PANEL  BRAIN NATRIURETIC PEPTIDE    Imaging Review No results found.   EKG Interpretation   Date/Time:  Friday June 19 2014 19:34:11 EDT Ventricular Rate:  56 PR Interval:  280 QRS Duration: 102 QT Interval:  439 QTC Calculation: 424 R Axis:   -26 Text Interpretation:  Sinus or ectopic atrial rhythm Prolonged PR interval  Borderline left axis deviation Repol abnrm suggests ischemia, lateral  leads No significant change since last tracing Confirmed by Lien Lyman  MD,  Javonta Gronau ([id number]) on 06/19/2014 7:41:44 PM      MDM   Final diagnoses:  Chest pain    Patient is chest pain-free at this time. Will be admitted to observation    Lacretia Leigh, MD 06/19/14 2139

## 2014-06-19 NOTE — H&P (Signed)
[facility] History and Physical  FELTON BUCZYNSKI PYP:[id number] DOB: 1945-08-03 DOA: 06/19/2014  Referring physician: ER PCP: Sherrie Mustache, MD   Chief Complaint: Left-sided chest pain  HPI: Randy Carpenter is a 69 y.o. male  This is a 69 year old man, history of CABG in 2006, presents now with 2-3 day history of intermittent left-sided chest pain. He describes the pain is sharp in nature and lasting 2-3 minutes. It is not associated with exertion. There is no sweating, nausea, dyspnea associated with it. He says the pain seems to go to his left arm. It is worsened by coughing. He has had a productive cough but this is quite normal for him over several years. He is a long-standing smoker. In view of his risk factors, is now being admitted for further investigation.   Review of Systems:  Apart from symptoms above, all systems negative.  Past Medical History  Diagnosis Date  . Hypertension   . CAD (coronary artery disease), autologous vein bypass graft     Cardiac Cath 20061.No aortic stenosis on pullback,LIMA large vessel with angiography normal. Abdominal aorta with moderate plaquing without significant stenosis. No abdominal aortic aneurysm.Left main distal 40% stenosis LAD the vessel is diffusely diseased giving rise to a single large diagonal. The proximal LAD has up to 90% stenosis. There is a 70% stenosis in the mid vessel after a large sept   . COPD (chronic obstructive pulmonary disease)   . Thyroid disease   . Hypercholesterolemia   . Tobacco abuse   . Obesity    Past Surgical History  Procedure Laterality Date  . Coronary artery bypass graft     Social History:  reports that he has been smoking Cigarettes.  He has a 14.75 pack-year smoking history. He does not have any smokeless tobacco history on file. He reports that he does not drink alcohol. His drug history is not on file.  No Known Allergies  Family History  Problem Relation Age of Onset  .  Pancreatic cancer Mother       Prior to Admission medications   Medication Sig Start Date End Date Taking? Authorizing Provider  amLODipine (NORVASC) 5 MG tablet Take 5 mg by mouth daily.   Yes Historical Provider, MD  aspirin 325 MG tablet Take 1 tablet (325 mg total) by mouth daily. 05/09/13  Yes Kathie Dike, MD  levothyroxine (SYNTHROID, LEVOTHROID) 150 MCG tablet Take 150 mcg by mouth daily before breakfast.   Yes Historical Provider, MD  levothyroxine (SYNTHROID, LEVOTHROID) 175 MCG tablet Take 175 mcg by mouth daily before breakfast.   Yes Historical Provider, MD  losartan-hydrochlorothiazide (HYZAAR) 100-25 MG per tablet Take 1 tablet by mouth daily.   Yes Historical Provider, MD  metoprolol (LOPRESSOR) 50 MG tablet Take 0.5 tablets (25 mg total) by mouth 2 (two) times daily. For high blood pressure 05/09/13  Yes Kathie Dike, MD  pravastatin (PRAVACHOL) 40 MG tablet Take 1 tablet (40 mg total) by mouth daily. 09/21/12  Yes Hester Mates, MD  clopidogrel (PLAVIX) 75 MG tablet Take 1 tablet (75 mg total) by mouth daily with breakfast. Patient not taking: Reported on 06/19/2014 05/10/13   Kathie Dike, MD   Physical Exam: Filed Vitals:   06/19/14 1928 06/19/14 2140  BP: 144/67 147/60  Pulse: 56 52  Temp: 98.2 F (36.8 C) 98.2 F (36.8 C)  TempSrc:  Oral  Resp: 18 14  Height: 5' 11.5" (1.816 m)   Weight: 102.513 kg (226 lb)   SpO2: 98%  95%    Wt Readings from Last 3 Encounters:  06/19/14 102.513 kg (226 lb)  05/08/13 104.055 kg (229 lb 6.4 oz)  09/20/12 107.412 kg (236 lb 12.8 oz)    General:  Appears calm and comfortable. He does not appear to be in pain. Obese. Eyes: PERRL, normal lids, irises & conjunctiva ENT: grossly normal hearing, lips & tongue Neck: no LAD, masses or thyromegaly Cardiovascular: RRR, no m/r/g. No LE edema. Telemetry: SR, no arrhythmias  Respiratory: CTA bilaterally, no w/r/r. Normal respiratory effort. Abdomen: soft, ntnd Skin: no rash or  induration seen on limited exam Musculoskeletal: grossly normal tone BUE/BLE Psychiatric: grossly normal mood and affect, speech fluent and appropriate Neurologic: grossly non-focal.          Labs on Admission:  Basic Metabolic Panel:  Recent Labs Lab 06/19/14 1941  NA 134*  K 3.6  CL 102  CO2 23  GLUCOSE 248*  BUN 12  CREATININE 0.91  CALCIUM 8.8   Liver Function Tests:  Recent Labs Lab 06/19/14 1941  AST 44*  ALT 52  ALKPHOS 94  BILITOT 0.5  PROT 6.7  ALBUMIN 3.7   No results for input(s): LIPASE, AMYLASE in the last 168 hours. No results for input(s): AMMONIA in the last 168 hours. CBC:  Recent Labs Lab 06/19/14 1941  WBC 7.6  NEUTROABS 3.8  HGB 16.2  HCT 45.5  MCV 81.1  PLT 97*   Cardiac Enzymes:  Recent Labs Lab 06/19/14 1941  TROPONINI <0.03    BNP (last 3 results)  Recent Labs  06/19/14 1941  BNP 29.0    ProBNP (last 3 results) No results for input(s): PROBNP in the last 8760 hours.  CBG: No results for input(s): GLUCAP in the last 168 hours.  Radiological Exams on Admission: Dg Chest 2 View  06/19/2014   CLINICAL DATA:  Chest pain, right neck pain, left hand numbness  EXAM: CHEST  2 VIEW  COMPARISON:  05/07/2013  FINDINGS: Cardiomediastinal silhouette is stable. Status post CABG. No acute infiltrate or pulmonary edema. Stable hyperinflation and chronic mild interstitial prominence. Bony thorax is unremarkable.  IMPRESSION: No active cardiopulmonary disease.  Status post CABG.  Stable COPD.   Electronically Signed   By: Lahoma Crocker M.D.   On: 06/19/2014 20:08    EKG: Independently reviewed. Normal sinus rhythm without any acute ST-T wave changes.  Assessment/Plan   1. Chest pain. The description does not sound cardiac in origin. However, he does have risk factors and indeed has a history of coronary artery disease. On his last admission approximately a year ago, he did have elevated troponin levels but refused further investigation  such as cardiac catheterization. We will cycle cardiac enzymes on telemetry floor. 2. Hypertension. Stable. 3. Obesity.  Further recommendations will depend on patient's hospital progress.  Code Status: Full code.   DVT Prophylaxis: Heparin.  Family Communication: I discussed the plan with the patient at the bedside.   Disposition Plan: Home when medically stable.  Time spent: 45 minutes.  Doree Albee [facility] Pager [phone].

## 2014-06-19 NOTE — ED Notes (Signed)
Pt received aspirin 324mg  by ems

## 2014-06-19 NOTE — ED Notes (Signed)
Pt c/o chest pain x 3 days.  

## 2014-06-20 DIAGNOSIS — I25119 Atherosclerotic heart disease of native coronary artery with unspecified angina pectoris: Secondary | ICD-10-CM

## 2014-06-20 DIAGNOSIS — R0789 Other chest pain: Secondary | ICD-10-CM

## 2014-06-20 DIAGNOSIS — I10 Essential (primary) hypertension: Secondary | ICD-10-CM

## 2014-06-20 DIAGNOSIS — Z951 Presence of aortocoronary bypass graft: Secondary | ICD-10-CM

## 2014-06-20 LAB — TROPONIN I: Troponin I: <0.03 ng/mL (ref <0.031)

## 2014-06-20 NOTE — Progress Notes (Signed)
Pt left AMA this morning around 10am. Pt informed of risks of him leaving. AMA sign out sheet signed by pt and added to his chart.

## 2014-06-20 NOTE — Discharge Summary (Signed)
Patient has decided to leave the hospital AMA. The RN and myself have discussed with him risk of leaving. He is adamant that he will not stay in the hospital any longer. Have advised him to at least follow up with his cardiologist in the outpatient setting which he agrees to.  Domingo Mend, MD [facility] Pager: [phone]

## 2014-06-20 NOTE — Progress Notes (Signed)
UR completed 

## 2015-02-25 ENCOUNTER — Ambulatory Visit (INDEPENDENT_AMBULATORY_CARE_PROVIDER_SITE_OTHER): Payer: Medicare HMO | Admitting: Otolaryngology

## 2015-02-25 ENCOUNTER — Other Ambulatory Visit (HOSPITAL_COMMUNITY)
Admission: RE | Admit: 2015-02-25 | Discharge: 2015-02-25 | Disposition: A | Discharge status: Home / Self Care | Payer: Medicare HMO | Source: Ambulatory Visit | Attending: Otolaryngology | Admitting: Otolaryngology

## 2015-02-25 DIAGNOSIS — K1370 Unspecified lesions of oral mucosa: Secondary | ICD-10-CM | POA: Insufficient documentation

## 2015-02-25 DIAGNOSIS — D3709 Neoplasm of uncertain behavior of other specified sites of the oral cavity: Secondary | ICD-10-CM | POA: Diagnosis not present

## 2015-02-25 DIAGNOSIS — K121 Other forms of stomatitis: Secondary | ICD-10-CM | POA: Diagnosis not present

## 2015-03-18 ENCOUNTER — Ambulatory Visit (INDEPENDENT_AMBULATORY_CARE_PROVIDER_SITE_OTHER): Payer: Medicare HMO | Admitting: Otolaryngology

## 2015-03-18 DIAGNOSIS — D3709 Neoplasm of uncertain behavior of other specified sites of the oral cavity: Secondary | ICD-10-CM | POA: Diagnosis not present

## 2015-11-17 IMAGING — DX DG CHEST 2V
2 series · 2 of 2 positions shown · non-contrast
Comparison: 05/07/2013

CLINICAL DATA: Chest pain, right neck pain, left hand numbness

EXAM:
CHEST  2 VIEW

[chest pa]
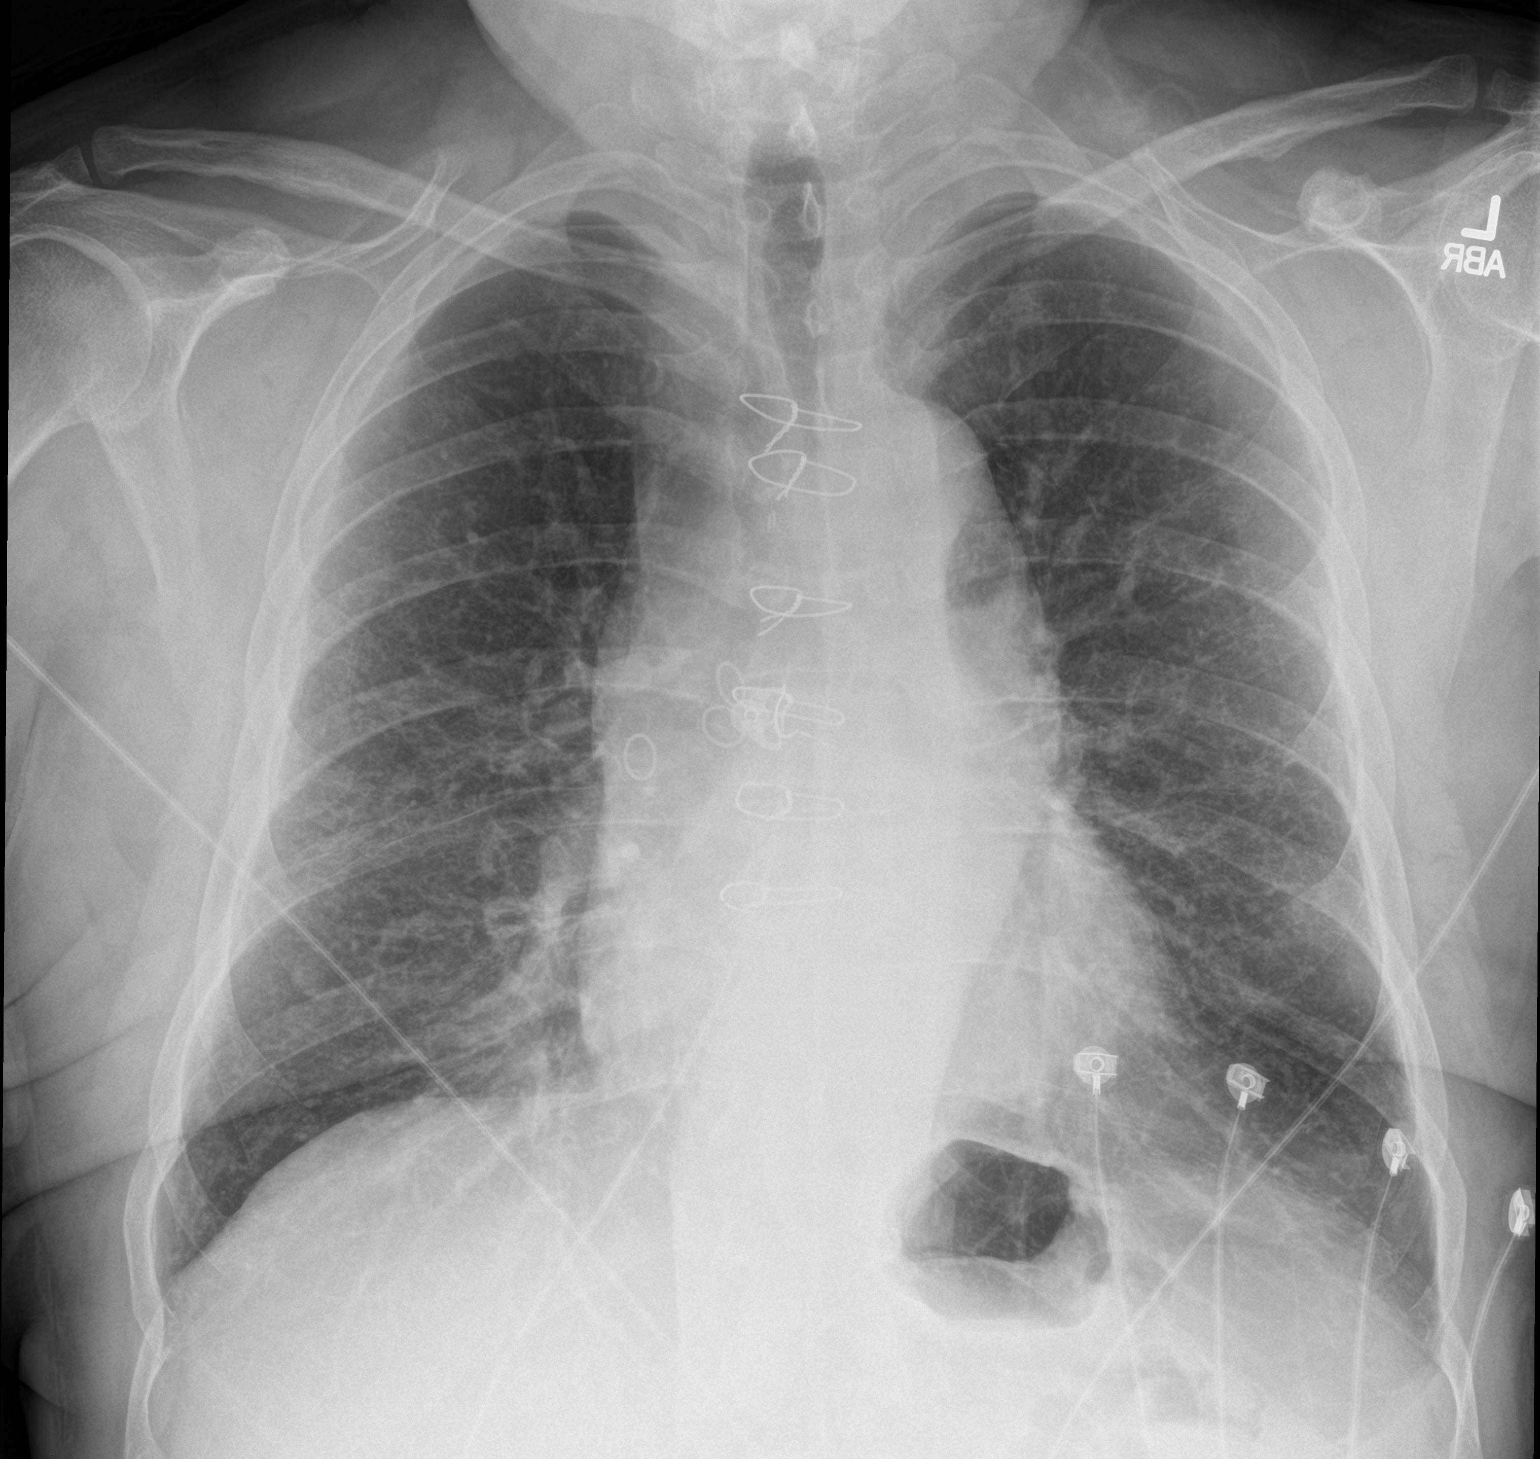

[chest lat]
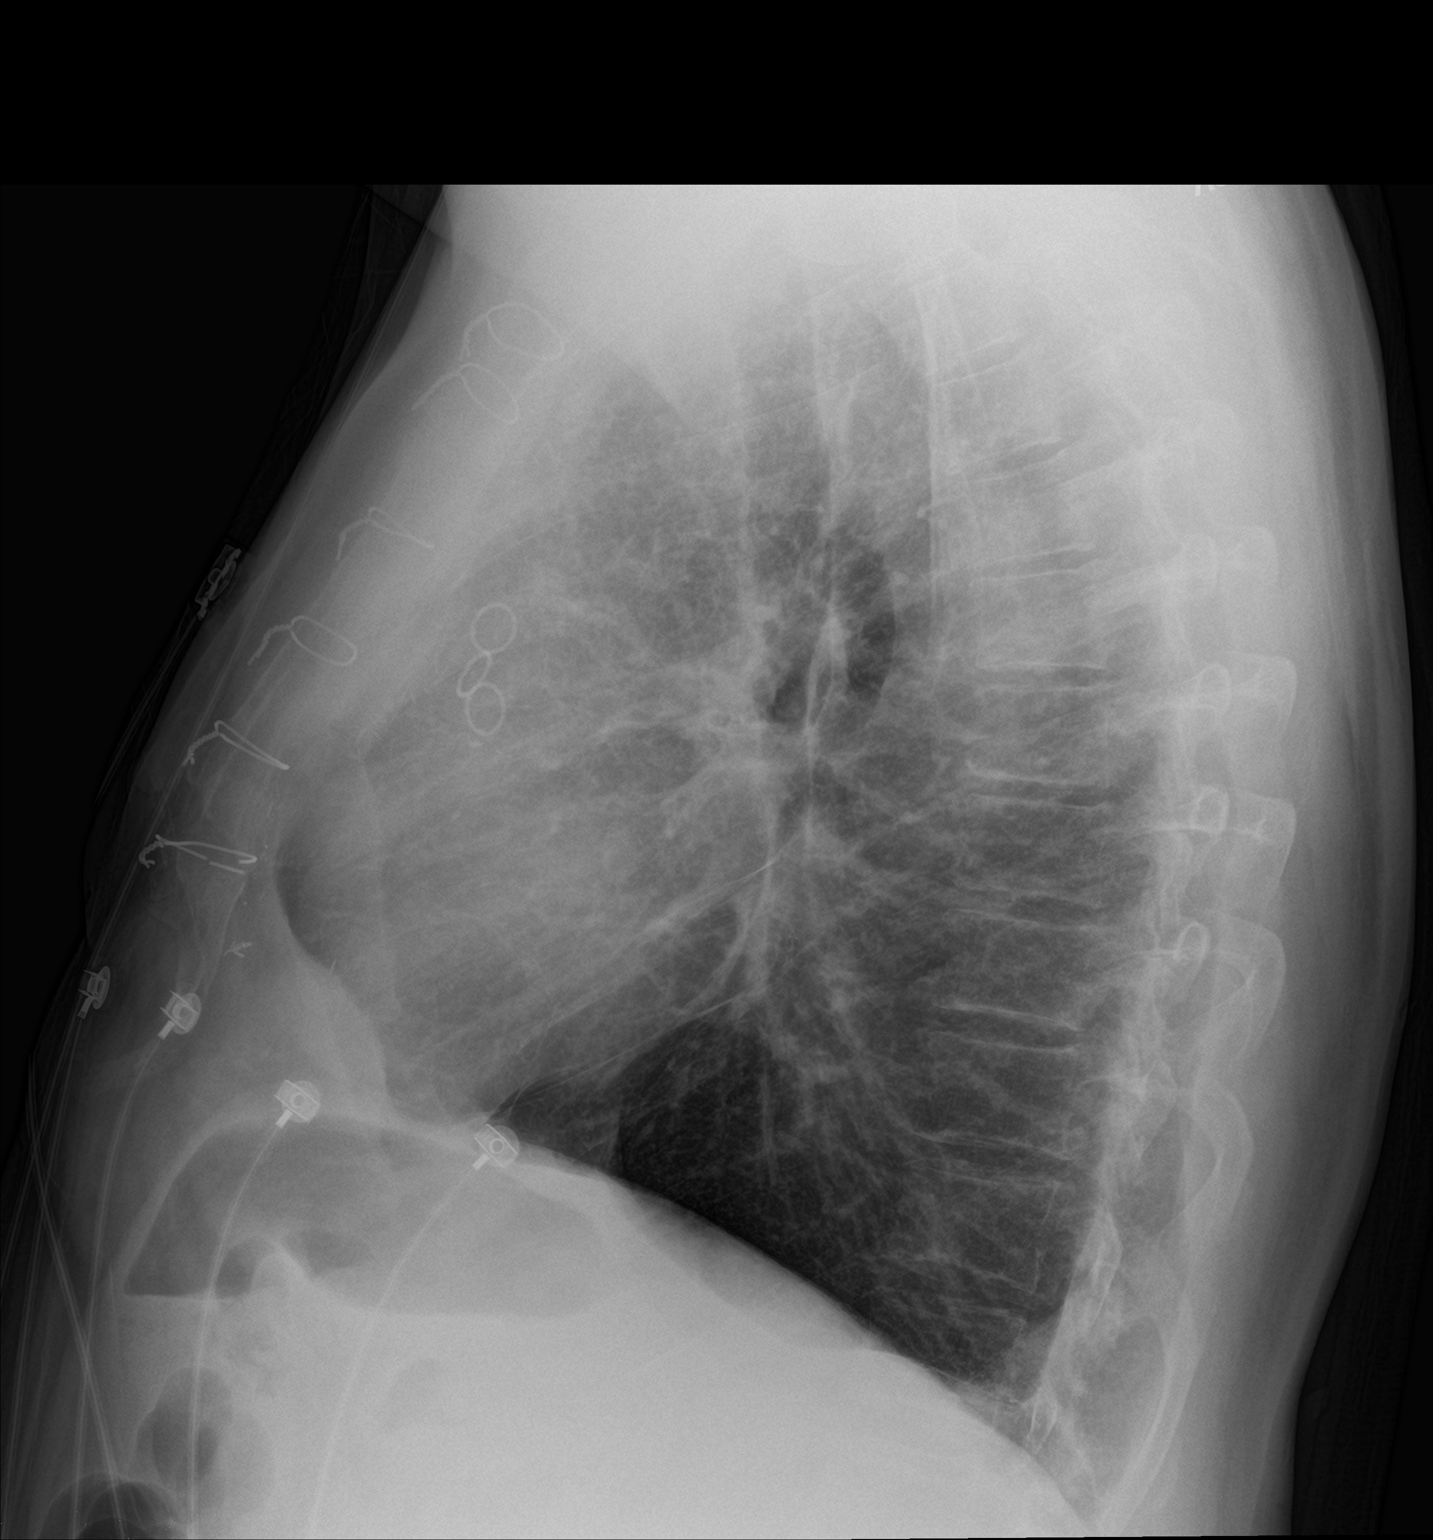

[2 of 2 positions shown; findings below may reference images not displayed]

FINDINGS: Cardiomediastinal silhouette is stable. Status post CABG. No acute
infiltrate or pulmonary edema. Stable hyperinflation and chronic
mild interstitial prominence. Bony thorax is unremarkable.
IMPRESSION: No active cardiopulmonary disease.  Status post CABG.  Stable COPD.

## 2016-08-01 ENCOUNTER — Encounter (HOSPITAL_COMMUNITY): Payer: Self-pay | Admitting: Pharmacy Technician

## 2016-08-01 ENCOUNTER — Emergency Department (HOSPITAL_COMMUNITY): Payer: Medicare HMO

## 2016-08-01 ENCOUNTER — Emergency Department (HOSPITAL_COMMUNITY)
Admission: EM | Admit: 2016-08-01 | Discharge: 2016-08-02 | Discharge status: Against Medical Advice | Payer: Medicare HMO | Attending: Emergency Medicine | Admitting: Emergency Medicine

## 2016-08-01 DIAGNOSIS — R079 Chest pain, unspecified: Secondary | ICD-10-CM | POA: Diagnosis present

## 2016-08-01 DIAGNOSIS — J189 Pneumonia, unspecified organism: Secondary | ICD-10-CM | POA: Diagnosis not present

## 2016-08-01 DIAGNOSIS — F17210 Nicotine dependence, cigarettes, uncomplicated: Secondary | ICD-10-CM | POA: Insufficient documentation

## 2016-08-01 DIAGNOSIS — Z8673 Personal history of transient ischemic attack (TIA), and cerebral infarction without residual deficits: Secondary | ICD-10-CM | POA: Insufficient documentation

## 2016-08-01 DIAGNOSIS — R509 Fever, unspecified: Secondary | ICD-10-CM

## 2016-08-01 DIAGNOSIS — R072 Precordial pain: Secondary | ICD-10-CM | POA: Insufficient documentation

## 2016-08-01 DIAGNOSIS — Z7982 Long term (current) use of aspirin: Secondary | ICD-10-CM | POA: Diagnosis not present

## 2016-08-01 DIAGNOSIS — Z79899 Other long term (current) drug therapy: Secondary | ICD-10-CM | POA: Diagnosis not present

## 2016-08-01 DIAGNOSIS — E876 Hypokalemia: Secondary | ICD-10-CM | POA: Insufficient documentation

## 2016-08-01 DIAGNOSIS — I2510 Atherosclerotic heart disease of native coronary artery without angina pectoris: Secondary | ICD-10-CM | POA: Insufficient documentation

## 2016-08-01 DIAGNOSIS — I10 Essential (primary) hypertension: Secondary | ICD-10-CM | POA: Insufficient documentation

## 2016-08-01 LAB — CBC WITH DIFFERENTIAL/PLATELET
BASOS ABS: 0 10*3/uL (ref 0.0–0.1)
BASOS PCT: 0 %
EOS PCT: 1 %
Eosinophils Absolute: 0.1 10*3/uL (ref 0.0–0.7)
HCT: 43.4 % (ref 39.0–52.0)
HEMOGLOBIN: 14.9 g/dL (ref 13.0–17.0)
Lymphocytes Relative: 17 %
Lymphs Abs: 1.7 10*3/uL (ref 0.7–4.0)
MCH: 28.2 pg (ref 26.0–34.0)
MCHC: 34.3 g/dL (ref 30.0–36.0)
MCV: 82 fL (ref 78.0–100.0)
Monocytes Absolute: 0.6 10*3/uL (ref 0.1–1.0)
Monocytes Relative: 6 %
NEUTROS PCT: 76 %
Neutro Abs: 7.4 10*3/uL (ref 1.7–7.7)
PLATELETS: 96 10*3/uL — AB (ref 150–400)
RBC: 5.29 MIL/uL (ref 4.22–5.81)
RDW: 13.8 % (ref 11.5–15.5)
WBC: 9.8 10*3/uL (ref 4.0–10.5)

## 2016-08-01 LAB — COMPREHENSIVE METABOLIC PANEL
ALT: 30 U/L (ref 17–63)
ANION GAP: 9 (ref 5–15)
AST: 37 U/L (ref 15–41)
Albumin: 3.6 g/dL (ref 3.5–5.0)
Alkaline Phosphatase: 85 U/L (ref 38–126)
BILIRUBIN TOTAL: 1 mg/dL (ref 0.3–1.2)
BUN: 13 mg/dL (ref 6–20)
CO2: 26 mmol/L (ref 22–32)
Calcium: 8.5 mg/dL — ABNORMAL LOW (ref 8.9–10.3)
Chloride: 102 mmol/L (ref 101–111)
Creatinine, Ser: 1.31 mg/dL — ABNORMAL HIGH (ref 0.61–1.24)
GFR calc Af Amer: >60 mL/min (ref >60)
GFR, EST NON AFRICAN AMERICAN: 53 mL/min — AB (ref >60)
Glucose, Bld: 172 mg/dL — ABNORMAL HIGH (ref 65–99)
Potassium: 2.9 mmol/L — ABNORMAL LOW (ref 3.5–5.1)
SODIUM: 137 mmol/L (ref 135–145)
TOTAL PROTEIN: 5.9 g/dL — AB (ref 6.5–8.1)

## 2016-08-01 LAB — I-STAT CG4 LACTIC ACID, ED
LACTIC ACID, VENOUS: 1.44 mmol/L (ref 0.5–1.9)
Lactic Acid, Venous: 2.45 mmol/L (ref 0.5–1.9)

## 2016-08-01 LAB — I-STAT TROPONIN, ED
TROPONIN I, POC: 0 ng/mL (ref 0.00–0.08)
Troponin i, poc: 0.01 ng/mL (ref 0.00–0.08)

## 2016-08-01 MED ORDER — DEXTROSE 5 % IV SOLN: 1.0000 g | Freq: Once | INTRAVENOUS | Status: DC

## 2016-08-01 MED ORDER — DEXTROSE 5 % IV SOLN: 500.0000 mg | Freq: Once | INTRAVENOUS | Status: DC

## 2016-08-01 MED ORDER — SODIUM CHLORIDE 0.9 % IV SOLN
250.0000 mL | Freq: Once | INTRAVENOUS | Status: AC
  Administered 2016-08-01: 250 mL via INTRAVENOUS

## 2016-08-01 MED ORDER — DEXTROSE 5 % IV SOLN
500.0000 mg | INTRAVENOUS | Status: DC
  Filled 2016-08-01: qty 500

## 2016-08-01 MED ORDER — POTASSIUM CHLORIDE CRYS ER 20 MEQ PO TBCR
40.0000 meq | EXTENDED_RELEASE_TABLET | Freq: Once | ORAL | Status: AC
  Administered 2016-08-01: 40 meq via ORAL
  Filled 2016-08-01: qty 2

## 2016-08-01 MED ORDER — ACETAMINOPHEN 325 MG PO TABS
650.0000 mg | ORAL_TABLET | Freq: Once | ORAL | Status: AC
  Administered 2016-08-01: 650 mg via ORAL
  Filled 2016-08-01: qty 2

## 2016-08-01 MED ORDER — CEFTRIAXONE SODIUM 1 G IJ SOLR
1.0000 g | INTRAMUSCULAR | Status: DC
  Filled 2016-08-01: qty 10

## 2016-08-01 NOTE — ED Notes (Signed)
Patient transported to X-ray 

## 2016-08-01 NOTE — ED Triage Notes (Signed)
Pt from home via [place] with reports of chest tightness accompanied by shaking/cold and hot chills/weakness starting approx 1 hour pta. Pt took 1 aspirin before EMS arrived and all symptoms resolved. VSS with ems. Pt with hx of CABG.

## 2016-08-01 NOTE — ED Provider Notes (Signed)
[facility] DEPT Provider Note   CSN: [id number] Arrival date & time: 08/01/16  2037     History   Chief Complaint Chief Complaint  Patient presents with  . Chest Pain    HPI Randy Carpenter is a 71 y.o. male.  The history is provided by the patient and the EMS personnel. No language interpreter was used.  Chest Pain     Randy Carpenter is a 71 y.o. male who presents to the Emergency Department complaining of chest pain.  He presents via EMS for evaluation of chest tightness that started 1 hour prior to ED arrival. He initially developed shaking chills and feeling cold and then hot. Shortly after that he developed central chest tightness with mild shortness of breath. He took an aspirin and this chest tightness resolved. Denies any cough, abdominal pain, nausea, vomiting, diarrhea, dysuria. He does have chronic lower extremity edema that resolves with diuretic. Extremity edema is unchanged from baseline.  Past Medical History:  Diagnosis Date  . CAD (coronary artery disease), autologous vein bypass graft    Cardiac Cath 20061.No aortic stenosis on pullback,LIMA large vessel with angiography normal. Abdominal aorta with moderate plaquing without significant stenosis. No abdominal aortic aneurysm.Left main distal 40% stenosis LAD the vessel is diffusely diseased giving rise to a single large diagonal. The proximal LAD has up to 90% stenosis. There is a 70% stenosis in the mid vessel after a large sept   . COPD (chronic obstructive pulmonary disease) ([place])   . Hypercholesterolemia   . Hypertension   . Obesity   . Thyroid disease   . Tobacco abuse     Patient Active Problem List   Diagnosis Date Noted  . Transaminitis 05/08/2013  . NSTEMI (non-ST elevated myocardial infarction) ([place]) 05/08/2013  . Unstable angina ([place]) 05/08/2013  . Chest pain 05/07/2013  . CAD (coronary artery disease) 05/07/2013  . TIA (transient ischemic attack) 09/20/2012  . Hx of CABG 09/20/2012  .  HTN (hypertension) 09/20/2012    Past Surgical History:  Procedure Laterality Date  . CORONARY ARTERY BYPASS GRAFT         Home Medications    Prior to Admission medications   Medication Sig Start Date End Date Taking? Authorizing Provider  amLODipine (NORVASC) 5 MG tablet Take 5 mg by mouth daily.   Yes [provider]  aspirin 325 MG tablet Take 1 tablet (325 mg total) by mouth daily. 05/09/13  Yes Kathie Dike, MD  HYDROcodone-acetaminophen (NORCO) 10-325 MG tablet Take 1 tablet by mouth 2 (two) times daily as needed for moderate pain or severe pain.  09/24/15  Yes [provider]  levothyroxine (SYNTHROID, LEVOTHROID) 175 MCG tablet Take 175 mcg by mouth daily before breakfast.   Yes [provider]  losartan-hydrochlorothiazide (HYZAAR) 100-25 MG per tablet Take 1 tablet by mouth daily.   Yes [provider]  metoprolol tartrate (LOPRESSOR) 25 MG tablet Take 25 mg by mouth 2 (two) times daily.   Yes [provider]  pravastatin (PRAVACHOL) 40 MG tablet Take 1 tablet (40 mg total) by mouth daily. 09/21/12  Yes Hester Mates, MD  clopidogrel (PLAVIX) 75 MG tablet Take 1 tablet (75 mg total) by mouth daily with breakfast. Patient not taking: Reported on 06/19/2014 05/10/13   Kathie Dike, MD  doxycycline (VIBRAMYCIN) 100 MG capsule Take 1 capsule (100 mg total) by mouth 2 (two) times daily. 08/02/16   Quintella Reichert, MD  metoprolol (LOPRESSOR) 50 MG tablet Take 0.5 tablets (25  mg total) by mouth 2 (two) times daily. For high blood pressure Patient not taking: Reported on 08/01/2016 05/09/13   Kathie Dike, MD    Family History Family History  Problem Relation Age of Onset  . Pancreatic cancer Mother     Social History Social History  Substance Use Topics  . Smoking status: Current Every Day Smoker    Packs/day: 0.25    Years: 59.00    Types: Cigarettes  . Smokeless tobacco: Not on file  . Alcohol use No     Allergies     Patient has no known allergies.   Review of Systems Review of Systems  Cardiovascular: Positive for chest pain.  All other systems reviewed and are negative.    Physical Exam Updated Vital Signs BP (!) 145/62   Pulse 73   Temp (!) 101.2 F (38.4 C) (Rectal)   Resp 14   Ht 5\' 11"  (1.803 m)   Wt 104.3 kg (230 lb)   SpO2 99%   BMI 32.08 kg/m   Physical Exam  Constitutional: He is oriented to person, place, and time. He appears well-developed and well-nourished.  HENT:  Head: Normocephalic and atraumatic.  Cardiovascular: Normal rate and regular rhythm.   No murmur heard. Pulmonary/Chest: Effort normal. No respiratory distress.  Occasional fine crackles in the right lung base  Abdominal: Soft. There is no tenderness. There is no rebound and no guarding.  Musculoskeletal: He exhibits no tenderness.  1+ edema to bilateral lower extremities  Neurological: He is alert and oriented to person, place, and time.  Skin: Skin is warm and dry.  Psychiatric: He has a normal mood and affect. His behavior is normal.  Nursing note and vitals reviewed.    ED Treatments / Results  Labs (all labs ordered are listed, but only abnormal results are displayed) Labs Reviewed  COMPREHENSIVE METABOLIC PANEL - Abnormal; Notable for the following:       Result Value   Potassium 2.9 (*)    Glucose, Bld 172 (*)    Creatinine, Ser 1.31 (*)    Calcium 8.5 (*)    Total Protein 5.9 (*)    GFR calc non Af Amer 53 (*)    All other components within normal limits  CBC WITH DIFFERENTIAL/PLATELET - Abnormal; Notable for the following:    Platelets 96 (*)    All other components within normal limits  I-STAT CG4 LACTIC ACID, ED - Abnormal; Notable for the following:    Lactic Acid, Venous 2.45 (*)    All other components within normal limits  CULTURE, BLOOD (ROUTINE X 2)  CULTURE, BLOOD (ROUTINE X 2)  URINALYSIS, ROUTINE W REFLEX MICROSCOPIC  MAGNESIUM  I-STAT TROPOININ, ED  I-STAT TROPOININ,  ED  I-STAT CG4 LACTIC ACID, ED    EKG  EKG Interpretation  Date/Time:  Tuesday Aug 01 2016 20:44:44 EDT Ventricular Rate:  82 PR Interval:    QRS Duration: 101 QT Interval:  393 QTC Calculation: 459 R Axis:   -12 Text Interpretation:  Sinus or ectopic atrial rhythm Short PR interval Probable left atrial enlargement Repol abnrm suggests ischemia, lateral leads Confirmed by Hazle Coca [id number]) on 08/01/2016 8:50:41 PM       Radiology Dg Chest 2 View  Result Date: 08/01/2016 CLINICAL DATA:  Shortness of breath. Patient complains he was hot and clammy earlier tonight. EXAM: CHEST  2 VIEW COMPARISON:  06/19/2014 FINDINGS: Postoperative changes in the mediastinum. Normal heart size and pulmonary vascularity. Slight interstitial pattern to the lungs  probably represents fibrosis although edema or interstitial pneumonitis could also have this appearance. No consolidation or airspace disease. No blunting of costophrenic angles. No pneumothorax. Tortuous aorta. IMPRESSION: Slight interstitial pattern to the lungs, likely fibrosis. No evidence of active pulmonary disease. Electronically Signed   By: Lucienne Capers M.D.   On: 08/01/2016 21:46    Procedures Procedures (including critical care time)  Medications Ordered in ED Medications  doxycycline (VIBRA-TABS) tablet 100 mg (not administered)  0.9 %  sodium chloride infusion (250 mLs Intravenous New Bag/Given 08/01/16 2236)  acetaminophen (TYLENOL) tablet 650 mg (650 mg Oral Given 08/01/16 2300)  potassium chloride SA (K-DUR,KLOR-CON) CR tablet 40 mEq (40 mEq Oral Given 08/01/16 2300)     Initial Impression / Assessment and Plan / ED Course  I have reviewed the triage vital signs and the nursing notes.  Pertinent labs & imaging results that were available during my care of the patient were reviewed by me and considered in my medical decision making (see chart for details).   patient here for evaluation of chills, chest tightness, shortness  of breath. His symptoms have resolved on ED arrival. He denies any fevers, significant cough. He does have some fine crackles in his right lung base chest x-ray with no clear evidence of pneumonia. He is febrile in the ED to 101.2. Concern for possible developing pneumonia and treating with antibiotics. On repeat assessment at 11:34 PM patient states he feels significantly improved his chest tightness is resolved and he wants to go home. Discussed recommendation for admission to the hospital for observation given his elevated lactate and low potassium and patient refuses. Discussed the checking labs at a minimum to evaluate for improvement and he is in agreement.  Recheck of labs demonstrate clearing of lactate and troponin is unchanged over time. On repeat assessment patient continues to refuse admission and requests to go home. He does not wish to stay for IV antibiotics. Will change his antibiotics to oral Doxy. Discussed home care, outpatient follow-up and return precautions. Final Clinical Impressions(s) / ED Diagnoses   Final diagnoses:  Precordial pain  Fever, unspecified fever cause  Hypokalemia  Community acquired pneumonia, unspecified laterality    New Prescriptions New Prescriptions   DOXYCYCLINE (VIBRAMYCIN) 100 MG CAPSULE    Take 1 capsule (100 mg total) by mouth 2 (two) times daily.     Quintella Reichert, MD 08/02/16 [phone]

## 2016-08-02 MED ORDER — DOXYCYCLINE HYCLATE 100 MG PO CAPS: 100.0000 mg | ORAL_CAPSULE | Freq: Two times a day (BID) | ORAL | 0 refills | Status: DC

## 2016-08-02 MED ORDER — DOXYCYCLINE HYCLATE 100 MG PO TABS
100.0000 mg | ORAL_TABLET | Freq: Once | ORAL | Status: AC
  Administered 2016-08-02: 100 mg via ORAL
  Filled 2016-08-02: qty 1

## 2016-08-02 NOTE — ED Notes (Signed)
Pt urged to stay for results of blood work. Pt adamant about "going home tonight". MD Ralene Bathe spoke with pt about risks of leaving AMA and patient still refusing to stay any longer.

## 2016-08-07 LAB — CULTURE, BLOOD (ROUTINE X 2)
Culture: NO GROWTH
Culture: NO GROWTH
Special Requests: ADEQUATE

## 2016-11-01 ENCOUNTER — Encounter (INDEPENDENT_AMBULATORY_CARE_PROVIDER_SITE_OTHER): Payer: Self-pay

## 2016-11-01 ENCOUNTER — Encounter (INDEPENDENT_AMBULATORY_CARE_PROVIDER_SITE_OTHER): Payer: Self-pay | Admitting: Internal Medicine

## 2016-11-23 ENCOUNTER — Ambulatory Visit (INDEPENDENT_AMBULATORY_CARE_PROVIDER_SITE_OTHER): Payer: Medicare Other | Admitting: Internal Medicine

## 2017-12-30 IMAGING — DX DG CHEST 2V
2 series · 2 of 2 positions shown · non-contrast
Comparison: 06/19/2014

CLINICAL DATA: Shortness of breath. Patient complains he was hot
and clammy earlier tonight.

EXAM:
CHEST  2 VIEW

[chest pa]
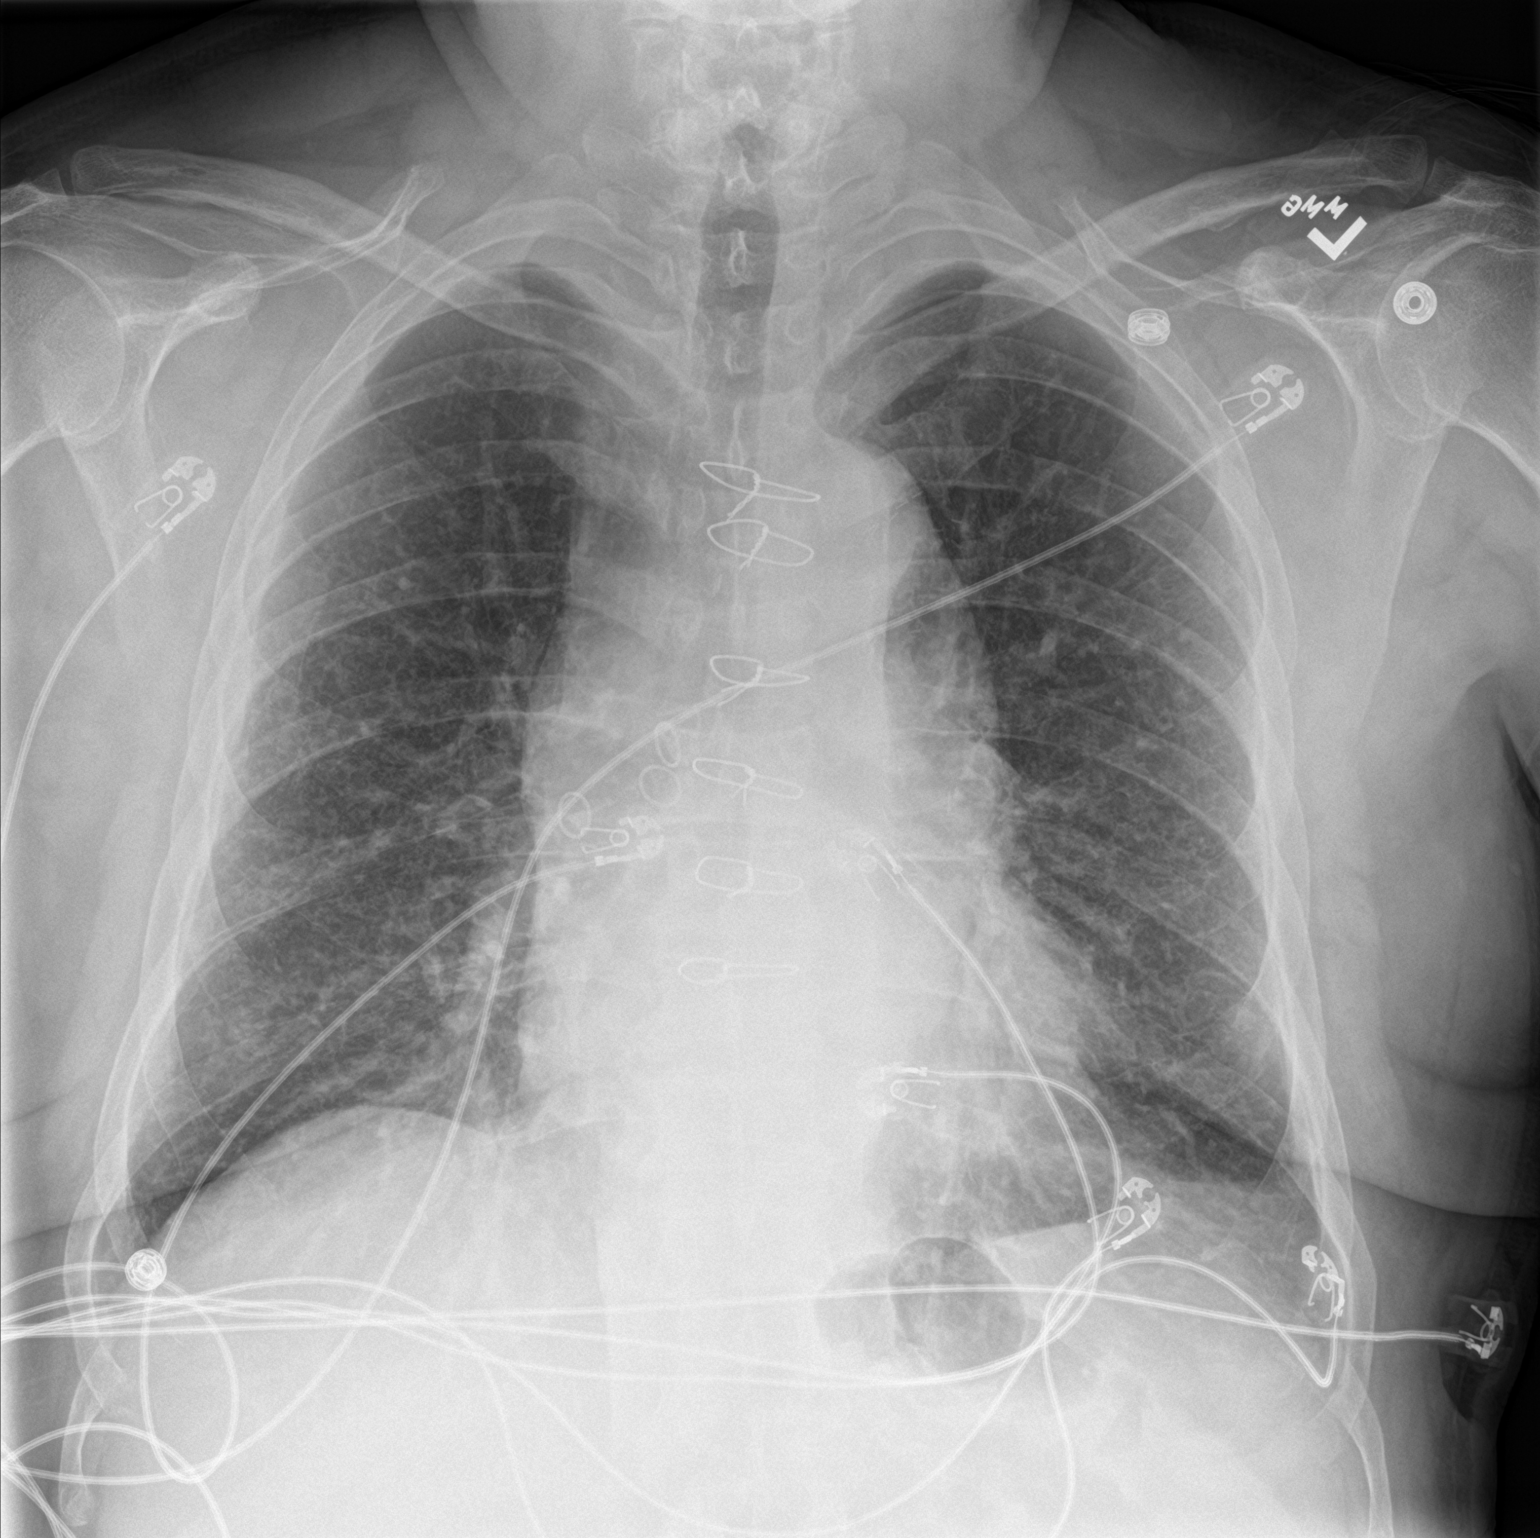

[chest lat]
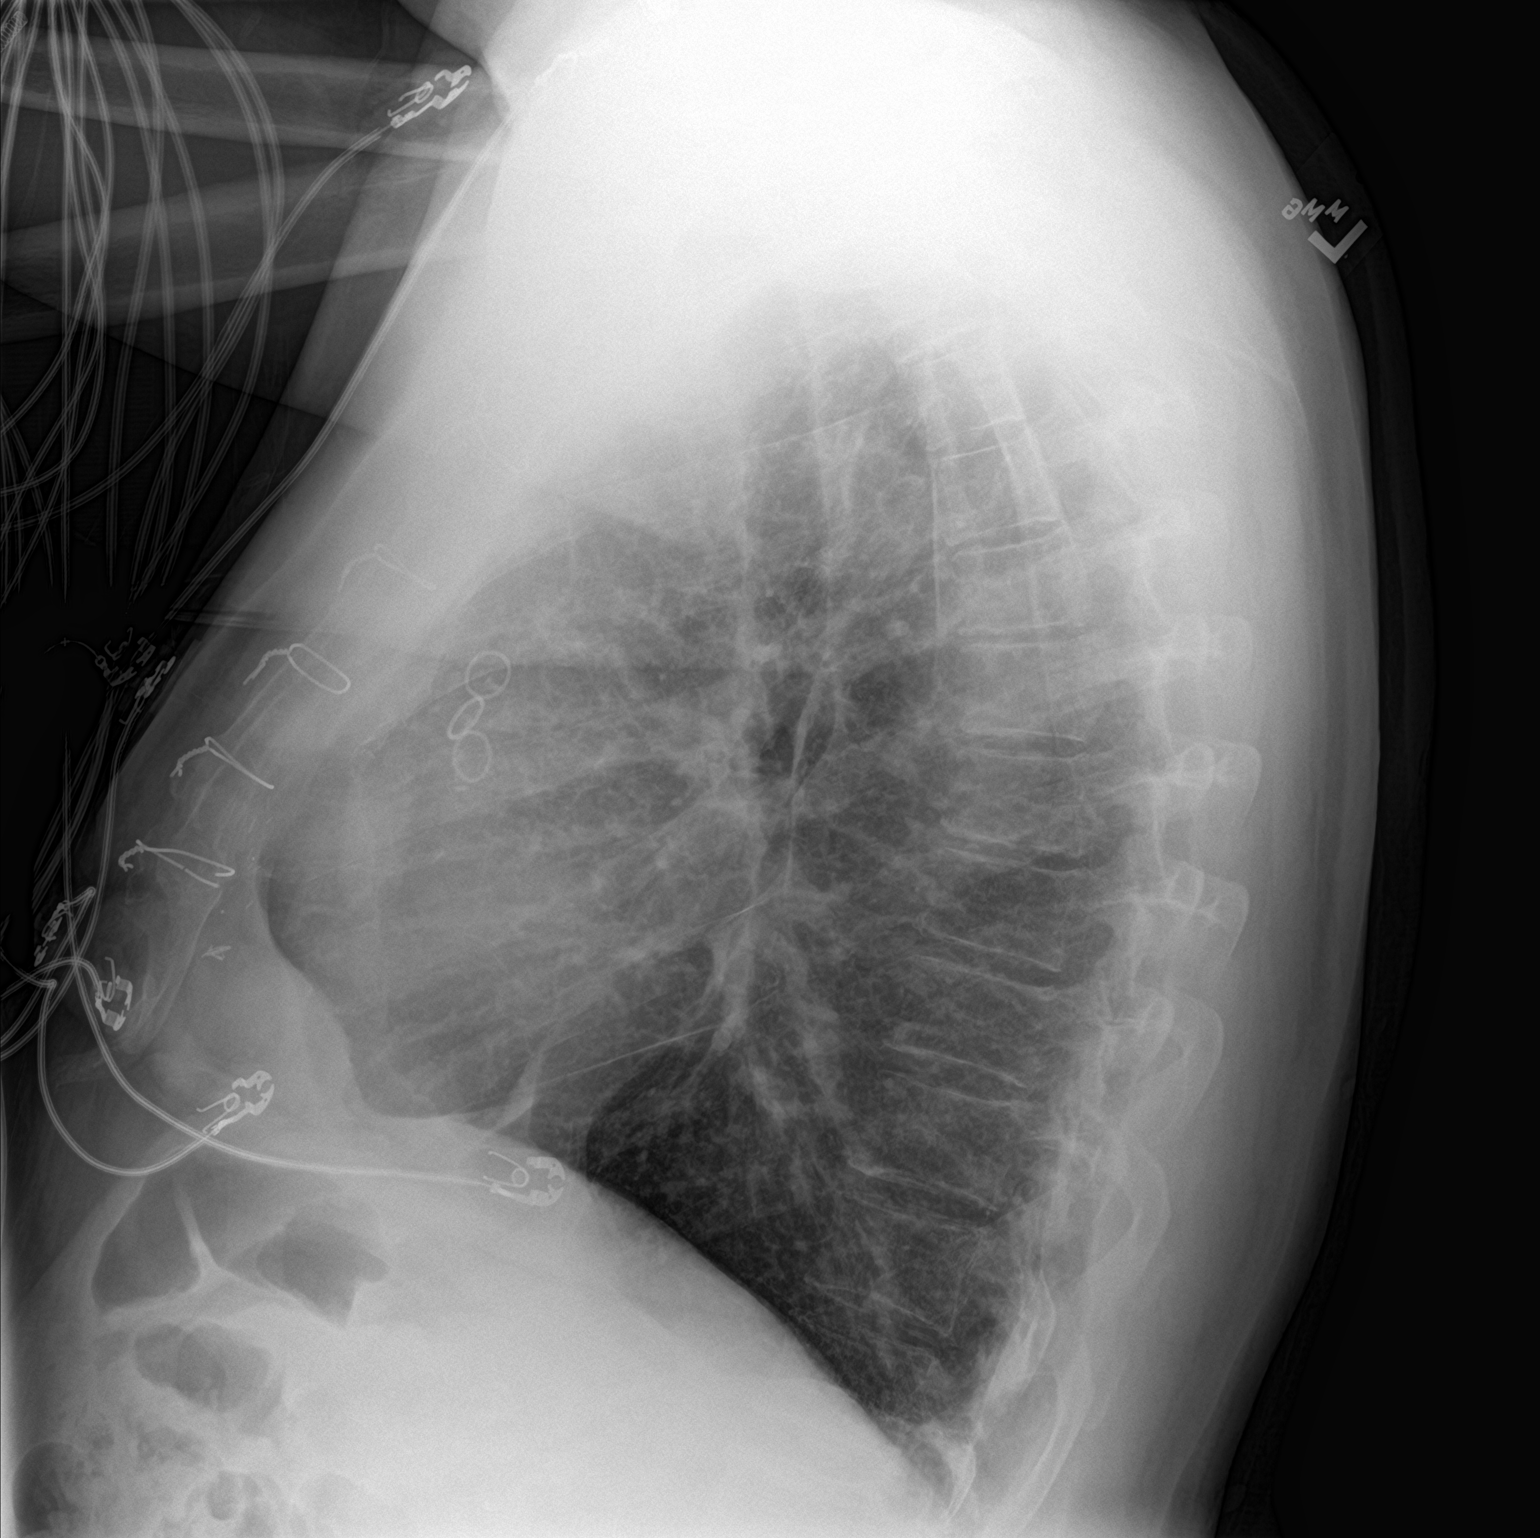

[2 of 2 positions shown; findings below may reference images not displayed]

FINDINGS: Postoperative changes in the mediastinum. Normal heart size and
pulmonary vascularity. Slight interstitial pattern to the lungs
probably represents fibrosis although edema or interstitial
pneumonitis could also have this appearance. No consolidation or
airspace disease. No blunting of costophrenic angles. No
pneumothorax. Tortuous aorta.
IMPRESSION: Slight interstitial pattern to the lungs, likely fibrosis. No
evidence of active pulmonary disease.

## 2024-04-10 ENCOUNTER — Emergency Department (HOSPITAL_COMMUNITY)

## 2024-04-10 ENCOUNTER — Emergency Department (HOSPITAL_COMMUNITY): Admission: EM | Admit: 2024-04-10 | Discharge: 2024-04-10 | Disposition: A | Discharge status: Home / Self Care

## 2024-04-10 ENCOUNTER — Encounter (HOSPITAL_COMMUNITY): Payer: Self-pay

## 2024-04-10 ENCOUNTER — Other Ambulatory Visit: Payer: Self-pay

## 2024-04-10 DIAGNOSIS — M25512 Pain in left shoulder: Secondary | ICD-10-CM | POA: Insufficient documentation

## 2024-04-10 DIAGNOSIS — I10 Essential (primary) hypertension: Secondary | ICD-10-CM | POA: Insufficient documentation

## 2024-04-10 DIAGNOSIS — M25552 Pain in left hip: Secondary | ICD-10-CM | POA: Diagnosis not present

## 2024-04-10 DIAGNOSIS — I2510 Atherosclerotic heart disease of native coronary artery without angina pectoris: Secondary | ICD-10-CM | POA: Diagnosis not present

## 2024-04-10 DIAGNOSIS — S0990XA Unspecified injury of head, initial encounter: Secondary | ICD-10-CM | POA: Diagnosis present

## 2024-04-10 DIAGNOSIS — W01198A Fall on same level from slipping, tripping and stumbling with subsequent striking against other object, initial encounter: Secondary | ICD-10-CM | POA: Diagnosis not present

## 2024-04-10 DIAGNOSIS — Z79899 Other long term (current) drug therapy: Secondary | ICD-10-CM | POA: Diagnosis not present

## 2024-04-10 DIAGNOSIS — Z7982 Long term (current) use of aspirin: Secondary | ICD-10-CM | POA: Diagnosis not present

## 2024-04-10 DIAGNOSIS — J449 Chronic obstructive pulmonary disease, unspecified: Secondary | ICD-10-CM | POA: Insufficient documentation

## 2024-04-10 DIAGNOSIS — R0789 Other chest pain: Secondary | ICD-10-CM | POA: Insufficient documentation

## 2024-04-10 DIAGNOSIS — W19XXXA Unspecified fall, initial encounter: Secondary | ICD-10-CM

## 2024-04-10 NOTE — Discharge Instructions (Addendum)
 As we discussed, all of your imaging was normal.  You are going to be sore in the coming days.  Please take 600 mg of ibuprofen every 6 hours as needed for pain.  You can take Tylenol  at the 3 to 4-hour mark as well.  Please follow-up with your primary care doctor for further evaluation.  You may return to the emergency department for any worsening symptoms.

## 2024-04-10 NOTE — ED Triage Notes (Signed)
 Pt states I fell. The ice is a little bit slick. Pt reports hitting his head but denies loc. Pt states he went into the house to lay down for a bit and started to have left hip and left shoulder pain.

## 2024-04-10 NOTE — ED Provider Notes (Signed)
 " Randy Carpenter EMERGENCY DEPARTMENT AT [facility] Provider Note   CSN: [id number] Arrival date & time: 04/10/24  1813     Patient presents with: Randy Carpenter Randy Carpenter is a 79 y.o. male patient with history of COPD, coronary disease, hypertension who presents to the emergency department today for further evaluation of a mechanical slip and fall that occurred just prior to arrival.  Patient was walking out of his home when he hit a patch of ice which she thought was snow and slipped and fell backwards.  He hit his left hip, left posterior chest wall, left shoulder, and struck his head.  He did not lose consciousness.  Patient does not take any blood thinners.  Chart review does reveal he has taken clopidogrel  in the past but patient has not been taking since 2016 according to the records.    Fall       Prior to Admission medications  Medication Sig Start Date End Date Taking? Authorizing Provider  amLODipine  (NORVASC ) 5 MG tablet Take 5 mg by mouth daily.    [provider]  aspirin  325 MG tablet Take 1 tablet (325 mg total) by mouth daily. 05/09/13   Antoinette Doe, MD  clopidogrel  (PLAVIX ) 75 MG tablet Take 1 tablet (75 mg total) by mouth daily with breakfast. Patient not taking: Reported on 06/19/2014 05/10/13   Antoinette Doe, MD  doxycycline  (VIBRAMYCIN ) 100 MG capsule Take 1 capsule (100 mg total) by mouth 2 (two) times daily. 08/02/16   Griselda Norris, MD  HYDROcodone -acetaminophen  [facility]) 10-325 MG tablet Take 1 tablet by mouth 2 (two) times daily as needed for moderate pain or severe pain.  09/24/15   [provider]  levothyroxine  (SYNTHROID , LEVOTHROID) 175 MCG tablet Take 175 mcg by mouth daily before breakfast.    [provider]  losartan -hydrochlorothiazide  (HYZAAR) 100-25 MG per tablet Take 1 tablet by mouth daily.    [provider]  metoprolol  (LOPRESSOR ) 50 MG tablet Take 0.5 tablets (25 mg total) by mouth 2 (two) times daily.  For high blood pressure Patient not taking: Reported on 08/01/2016 05/09/13   Antoinette Doe, MD  metoprolol  tartrate (LOPRESSOR ) 25 MG tablet Take 25 mg by mouth 2 (two) times daily.    [provider]  pravastatin  (PRAVACHOL ) 40 MG tablet Take 1 tablet (40 mg total) by mouth daily. 09/21/12   Delores Bernardino POUR, MD    Allergies: Patient has no known allergies.    Review of Systems  All other systems reviewed and are negative.   Updated Vital Signs BP (!) 159/77 (BP Location: Right Arm)   Pulse 78   Temp 97.6 F (36.4 C) (Temporal)   Resp 20   Ht 5' 11 (1.803 m)   Wt 81.6 kg   SpO2 98%   BMI 25.10 kg/m   Physical Exam Vitals and nursing note reviewed.  Constitutional:      Appearance: Normal appearance.  HENT:     Head: Normocephalic and atraumatic.     Comments: No open lacerations, ecchymosis, or hematomas to the occiput. Eyes:     General:        Right eye: No discharge.        Left eye: No discharge.     Conjunctiva/sclera: Conjunctivae normal.  Neck:     Comments: Full range of motion in the neck. Pulmonary:     Effort: Pulmonary effort is normal.  Chest:     Comments: There is tenderness to the left  posterior chest wall. Skin:    General: Skin is warm and dry.     Findings: No rash.  Neurological:     General: No focal deficit present.     Mental Status: He is alert.     GCS: GCS eye subscore is 4. GCS verbal subscore is 5. GCS motor subscore is 6.     Comments: Cranial nerves II through XII are intact.  5/5 strength to the upper and lower extremities.  Normal sensation to the upper and lower extremities.  Speech is normal.  Psychiatric:        Mood and Affect: Mood normal.        Behavior: Behavior normal.     (all labs ordered are listed, but only abnormal results are displayed) Labs Reviewed - No data to display  EKG: None  Radiology: CT Cervical Spine Wo Contrast Result Date: 04/10/2024 EXAM: CT CERVICAL SPINE WITHOUT CONTRAST 04/10/2024  07:54:35 PM TECHNIQUE: CT of the cervical spine was performed without the administration of intravenous contrast. Multiplanar reformatted images are provided for review. Automated exposure control, iterative reconstruction, and/or weight based adjustment of the mA/kV was utilized to reduce the radiation dose to as low as reasonably achievable. COMPARISON: None available. CLINICAL HISTORY: Neck trauma (Age >= 65y). Neck trauma; Age >= 65 years. FINDINGS: BONES AND ALIGNMENT: No acute fracture or traumatic malalignment. DEGENERATIVE CHANGES: Multilevel severe degenerative changes of the spine. Associated severe left C2-C3 and C3-C4 osseous neural foraminal stenosis. No severe osseous central canal stenosis. SOFT TISSUES: No prevertebral soft tissue swelling. IMPRESSION: 1. No evidence of acute traumatic injury. 2. Severe left C2-C3 and C3-C4 osseous neural foraminal stenosis due to degnerative chagnes. Electronically signed by: Morgane Naveau MD 04/10/2024 08:03 PM EST RP Workstation: [id number]   CT Head Wo Contrast Result Date: 04/10/2024 EXAM: CT HEAD WITHOUT CONTRAST 04/10/2024 07:54:35 PM TECHNIQUE: CT of the head was performed without the administration of intravenous contrast. Automated exposure control, iterative reconstruction, and/or weight based adjustment of the mA/kV was utilized to reduce the radiation dose to as low as reasonably achievable. COMPARISON: None available. CLINICAL HISTORY: Head trauma, moderate-severe. FINDINGS: BRAIN AND VENTRICLES: No acute hemorrhage. No evidence of acute infarct. Proportional prominence of ventricles and sulci, consistent with diffuse cerebral parenchymal volume loss. Periventricular and subcortical white matter hypoattenuation, consistent with moderate chronic ischemic microvascular disease. No hydrocephalus. No extra-axial collection. No mass effect or midline shift. ORBITS: No acute abnormality. SINUSES: No acute abnormality. SOFT TISSUES AND SKULL: Dermal round  soft tissue density measuring 4 mm along the right maxillary soft tissues. No skull fracture. VASCULATURE: Calcified atherosclerotic plaque in cavernous/supraclinoid internal carotid arteries. IMPRESSION: 1. No acute intracranial abnormality. Electronically signed by: Morgane Naveau MD 04/10/2024 08:01 PM EST RP Workstation: [id number]   DG Hip Unilat W or Wo Pelvis 2-3 Views Left Result Date: 04/10/2024 EXAM: 2-3 VIEW(S) XRAY OF THE BILATERAL HIP 04/10/2024 07:33:43 PM COMPARISON: None available. CLINICAL HISTORY: Left shoulder pain. FINDINGS: BONES AND JOINTS: No acute fracture. Mild bilateral degenerative hip arthritis. SOFT TISSUES: Vascular calcifications. IMPRESSION: 1. Mild bilateral degenerative hip arthritis. 2. Vascular calcifications. Electronically signed by: Dorethia Molt MD 04/10/2024 07:44 PM EST RP Workstation: [id number]   DG Ribs Unilateral W/Chest Left Result Date: 04/10/2024 CLINICAL DATA:  Left shoulder pain. EXAM: LEFT RIBS AND CHEST - 3+ VIEW COMPARISON:  None Available. FINDINGS: Diffuse chronic interstitial coarsening. Right mid to lower lung field subpleural densities may be chronic and scarring. Developing infiltrate or underlying mass is not excluded. Chest  CT may provide better evaluation. A small right pleural effusion suspected. No pneumothorax. Stable cardiac silhouette. Median sternotomy wires and CABG vascular clips. No acute osseous pathology. No displaced rib fractures. IMPRESSION: 1. No displaced rib fractures. 2. Right mid to lower lung field subpleural densities may be chronic and scarring. Developing infiltrate or underlying mass is not excluded. Chest CT may provide better evaluation. Electronically Signed   By: Vanetta Chou M.D.   On: 04/10/2024 19:43   DG Shoulder Left Result Date: 04/10/2024 CLINICAL DATA:  Left shoulder pain. EXAM: LEFT SHOULDER - 2+ VIEW COMPARISON:  None Available. FINDINGS: No acute fracture or dislocation. The bones are osteopenic. Mild  arthritic changes. Median sternotomy wires and CABG vascular clips. The soft tissues are remarkable. IMPRESSION: 1. No acute fracture or dislocation. 2. Mild arthritic changes. Electronically Signed   By: Vanetta Chou M.D.   On: 04/10/2024 19:38     Procedures   Medications Ordered in the ED - No data to display  Clinical Course as of 04/10/24 2044  Thu Apr 10, 2024  2027 CT Cervical Spine Wo Contrast Negative for any acute fracture.  I do agree with the radiologist interpretation. [CF]  2027 CT Head Wo Contrast Negative for any intracranial hemorrhage.  I do agree with radiologist interpretation. [CF]  2027 DG Hip Unilat W or Wo Pelvis 2-3 Views Left Negative for any fracture.  I do agree with radiologist interpretation. [CF]  2027 DG Ribs Unilateral W/Chest Left No evidence of rib fractures. [CF]  2028 DG Shoulder Left No evidence of fracture.  I do agree with radiologist interpretation. [CF]  2042 On repeat evaluation, patient is overall doing well.  He is in no acute distress.  We reviewed over all imaging with him at the bedside.  All questions and concerns addressed.  He is going to take over-the-counter analgesics like Tylenol  and or ibuprofen at home.  Follow-up with his primary care doctor for further evaluation. [CF]    Clinical Course User Index [CF] Randy Cameron HERO, PA-C    Medical Decision Making Randy Carpenter is a 79 y.o. male patient who presents to the emergency department today for further evaluation of mechanical slip and fall.  Given the patient's age and mechanism of injury I will get CT scans over the head and neck to further assess for any intracranial hemorrhage or spinal fracture.  Will also get imaging over the left knee, left posterior chest wall, and left shoulder.  All imaging is reassuring.  Patient will likely be sore in the coming days.  Will have him take 600 mg of ibuprofen every 6 hours as needed for pain.  He can add on Tylenol  3 to 4-hour mark.   I will have him follow-up with his primary care doctor for further evaluation.  Strict return precaution were discussed.  He is safe for discharge.   Amount and/or Complexity of Data Reviewed Radiology: ordered. Decision-making details documented in ED Course.     Final diagnoses:  Fall, initial encounter  Left hip pain  Acute pain of left shoulder  Injury of head, initial encounter    ED Discharge Orders     None          Randy Carpenter, DEVONNA 04/10/24 2044    Gennaro Bouchard L, DO 04/12/24 1451  "

## 2024-04-10 NOTE — ED Notes (Signed)
 Portable xray at bedside.

## 2024-04-10 NOTE — ED Notes (Signed)
 Patient transported to CT

## 2024-04-17 NOTE — Progress Notes (Signed)
 History of Present Illness The patient is an 79 year old male who presents for his annual Medicare wellness visit and annual physical examination. 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  He reports intermittent breathing issues. He has not undergone any recent eye exams but reports satisfactory visual acuity. His sleep pattern is normal. He has been living independently for the past 38 years.  He experienced a fall on ice on 04/11/2024, resulting in pain extending from his hip to his shoulder blade on the left side. Despite the severity of the impact, no fractures were reported, but he sustained significant internal bruising.  His blood pressure is low, but he has not experienced any episodes of lightheadedness or dizziness.  Sleep: Reports good sleep Living Condition: Lives independently  PAST SURGICAL HISTORY: CABG  Vitals:   04/17/24 0925  BP: 110/60  Pulse: 50  Resp: 20  Temp: 98 F (36.7 C)  SpO2: 94%   General:  Well Developed, Well Nourished, No distress HEENT: Normocephalic, atraumatic, PERRLA, Conjunctiva normal Neck:  Supple with normal range of motion, No thyromegaly, bruits, or JVD  CV:  Heart with regular rate and rhythm No murmurs audible Lungs:  CTA with normal effort no adventitious noises Abdomen:  Soft and Non-tender.   Extremities: 5/5 strength and resistance bilaterally, no clubbing cyanosis, or edema.  Neuro:  No focal deficits, CN II-XII intact, A&O x 3.   GU:  No masses, no hernias  Rectal:  deferred Skin:  No Focal Rashes, open lesions, or suspicious moles Psych:  Appears emotionally stable.  Good thought and speech quality.  Good eye contact.   Well groomed.  Well dressed.  Light affect.   Assessment & Plan  1. Medicare annual wellness visit, subsequent      2.  Chronic left-sided low back pain without sciatica  HYDROcodone -acetaminophen  (NORCO) 10-325 mg per tablet    3. Chronic, continuous use of opioids  HYDROcodone -acetaminophen  (NORCO) 10-325 mg per tablet    4. Medication management  POCT UDS 12+Bup    5. Primary osteoarthritis involving multiple joints      6. Chronic pain syndrome      7. Essential hypertension  Comprehensive Metabolic Panel   CBC And Differential    8. Stage 3a chronic kidney disease (*)  POCT ACR URINE    9. Dyslipidemia  Lipid Panel With LDL/HDL Ratio    10. Coronary artery disease due to lipid rich plaque      11. Acquired hypothyroidism  TSH    12. Chronic obstructive pulmonary disease, unspecified COPD type (*)      13. Annual physical exam       MDM:   1. Annual Medicare wellness visit: - He reports no issues with lightheadedness or dizziness. He fell on ice last Friday, resulting in pain from his hip to his shoulder blade on the left side, but no fractures were identified. - He has not had any recent eye exams but reports satisfactory vision for his age. - A comprehensive blood work panel will be ordered today.  2. Chronic pain: - He has chronic pain secondary to sciatica and arthritis in multiple joints. - His pain medication will be sent to the pharmacy.  3. Hypertension: - His blood pressure is noted to be low, but he reports no symptoms of lightheadedness or dizziness.  Sam vomited while awaiting  to have his blood drawn. He denies any chest pain or shortness of breath or nausea or dizziness or light-headedness after the episode.  Patient  verbalized to me that they understood what their problem is, what they need to do about it, and why it is important that they do it.  The patient/family voices understanding of all medications. No barriers to adherence were noted. Patient is taking all medications as prescribed and is tolerating well.  Plan for follow-up as discussed or as needed if any  worsening symptoms or change in condition.       Medicare AWV  Randy Carpenter is a 79 y.o. male who presents for his subsequent annual wellness visit for Medicare.  Clinical documentation was reviewed and is accessible via encounter-level attachments.    Any physical exam components or additional concerns beyond the scope of the Annual Wellness Visit may be documented in a separate note within this encounter.  Medicare Required Components     Reviewed and updated this visit by provider: Tobacco  Allergies  Meds  Problems  Soc Hx PDMP       Substance Use Disorder Risk Statement: Low Substance Use Disorder / Overdose risk - Risk factors were reviewed and patient is low risk   Patient Care Team: Kip A Corrington, MD as PCP - General (Family Medicine) Johnsie LITTIE Rattler, RN as Registered Nurse  Vitals   Vitals:   04/17/24 0925  BP: 110/60  Patient Position: Sitting  Pulse: 50  Temp: 98 F (36.7 C)  TempSrc: Temporal  Resp: 20  Height: 5' 11 (1.803 m)  Weight: 178 lb (80.7 kg)  SpO2: 94%  BMI (Calculated): 24.8  PainSc: 0-No pain    Disposition   1. Medicare annual wellness visit, subsequent (Primary) 2. Chronic left-sided low back pain without sciatica -     HYDROcodone -acetaminophen  (NORCO) 10-325 mg per tablet; Take one tablet by mouth every 8 (eight) hours as needed for Pain for up to 30 days. Max Daily Amount: 3 tablets, Starting Thu 04/17/2024, Until Sat 05/17/2024 at 2359, Normal 3. Chronic, continuous use of opioids -     HYDROcodone -acetaminophen  (NORCO) 10-325 mg per tablet; Take one tablet by mouth every 8 (eight) hours as needed for Pain for up to 30 days. Max Daily Amount: 3 tablets, Starting Thu 04/17/2024, Until Sat 05/17/2024 at 2359, Normal 4. Medication management -     POCT UDS 12+Bup 5. Primary osteoarthritis involving multiple joints 6. Chronic pain syndrome 7. Essential hypertension -     Comprehensive Metabolic Panel; Future -     CBC And  Differential; Future 8. Stage 3a chronic kidney disease (*) -     POCT ACR URINE 9. Dyslipidemia -     Lipid Panel With LDL/HDL Ratio; Future 10. Coronary artery disease due to lipid rich plaque 11. Acquired hypothyroidism -     TSH; Future 12. Chronic obstructive pulmonary disease, unspecified COPD type (*) 13. Annual physical exam   No follow-ups on file.   Health maintenance issues were discussed with the patient.  A written plan was provided to the patient in the form of patient instructions in the After Visit Summary document.

## 2024-04-18 ENCOUNTER — Inpatient Hospital Stay (HOSPITAL_COMMUNITY): Admission: EM | Admit: 2024-04-18 | Source: Home / Self Care | Admitting: Family Medicine

## 2024-04-18 ENCOUNTER — Emergency Department (HOSPITAL_COMMUNITY)

## 2024-04-18 DIAGNOSIS — D696 Thrombocytopenia, unspecified: Secondary | ICD-10-CM | POA: Insufficient documentation

## 2024-04-18 DIAGNOSIS — N179 Acute kidney failure, unspecified: Principal | ICD-10-CM | POA: Diagnosis present

## 2024-04-18 DIAGNOSIS — I2510 Atherosclerotic heart disease of native coronary artery without angina pectoris: Secondary | ICD-10-CM | POA: Diagnosis present

## 2024-04-18 DIAGNOSIS — I10 Essential (primary) hypertension: Secondary | ICD-10-CM | POA: Diagnosis present

## 2024-04-18 DIAGNOSIS — N2889 Other specified disorders of kidney and ureter: Secondary | ICD-10-CM | POA: Insufficient documentation

## 2024-04-18 DIAGNOSIS — E039 Hypothyroidism, unspecified: Secondary | ICD-10-CM | POA: Insufficient documentation

## 2024-04-18 DIAGNOSIS — G459 Transient cerebral ischemic attack, unspecified: Secondary | ICD-10-CM | POA: Diagnosis present

## 2024-04-18 LAB — COMPREHENSIVE METABOLIC PANEL WITH GFR
ALT: 17 U/L (ref 0–44)
AST: 21 U/L (ref 15–41)
Albumin: 4.2 g/dL (ref 3.5–5.0)
Alkaline Phosphatase: 165 U/L — ABNORMAL HIGH (ref 38–126)
Anion gap: 14 (ref 5–15)
BUN: 40 mg/dL — ABNORMAL HIGH (ref 8–23)
CO2: 20 mmol/L — ABNORMAL LOW (ref 22–32)
Calcium: 9.6 mg/dL (ref 8.9–10.3)
Chloride: 104 mmol/L (ref 98–111)
Creatinine, Ser: 3.31 mg/dL — ABNORMAL HIGH (ref 0.61–1.24)
GFR, Estimated: 18 mL/min — ABNORMAL LOW
Glucose, Bld: 75 mg/dL (ref 70–99)
Potassium: 4.9 mmol/L (ref 3.5–5.1)
Sodium: 138 mmol/L (ref 135–145)
Total Bilirubin: 0.7 mg/dL (ref 0.0–1.2)
Total Protein: 6.9 g/dL (ref 6.5–8.1)

## 2024-04-18 LAB — PREPARE PLATELET PHERESIS
Unit division: 0
Unit division: 0

## 2024-04-18 LAB — URINALYSIS, ROUTINE W REFLEX MICROSCOPIC
Bilirubin Urine: NEGATIVE
Glucose, UA: NEGATIVE mg/dL
Ketones, ur: NEGATIVE mg/dL
Leukocytes,Ua: NEGATIVE
Nitrite: NEGATIVE
Protein, ur: 30 mg/dL — AB
Specific Gravity, Urine: 1.013 (ref 1.005–1.030)
pH: 5 (ref 5.0–8.0)

## 2024-04-18 LAB — BPAM PLATELET PHERESIS
Blood Product Expiration Date: 202602102359
Blood Product Expiration Date: 202602102359
Unit Type and Rh: 6200
Unit Type and Rh: 6200

## 2024-04-18 LAB — CBC WITH DIFFERENTIAL/PLATELET
Abs Immature Granulocytes: 0.01 10*3/uL (ref 0.00–0.07)
Basophils Absolute: 0.1 10*3/uL (ref 0.0–0.1)
Basophils Relative: 1 %
Eosinophils Absolute: 0.4 10*3/uL (ref 0.0–0.5)
Eosinophils Relative: 7 %
HCT: 41.2 % (ref 39.0–52.0)
Hemoglobin: 13.5 g/dL (ref 13.0–17.0)
Immature Granulocytes: 0 %
Lymphocytes Relative: 37 %
Lymphs Abs: 2 10*3/uL (ref 0.7–4.0)
MCH: 28.4 pg (ref 26.0–34.0)
MCHC: 32.8 g/dL (ref 30.0–36.0)
MCV: 86.7 fL (ref 80.0–100.0)
Monocytes Absolute: 0.6 10*3/uL (ref 0.1–1.0)
Monocytes Relative: 11 %
Neutro Abs: 2.4 10*3/uL (ref 1.7–7.7)
Neutrophils Relative %: 44 %
Platelets: 29 10*3/uL — CL (ref 150–400)
RBC: 4.75 MIL/uL (ref 4.22–5.81)
RDW: 14.3 % (ref 11.5–15.5)
WBC: 5.5 10*3/uL (ref 4.0–10.5)
nRBC: 0 % (ref 0.0–0.2)

## 2024-04-18 LAB — T4, FREE: Free T4: 0.98 ng/dL (ref 0.80–2.00)

## 2024-04-18 LAB — TSH: TSH: 21.6 u[IU]/mL — ABNORMAL HIGH (ref 0.350–4.500)

## 2024-04-18 LAB — ABO/RH: ABO/RH(D): A POS

## 2024-04-18 LAB — MAGNESIUM: Magnesium: 2 mg/dL (ref 1.7–2.4)

## 2024-04-18 LAB — LACTATE DEHYDROGENASE: LDH: 202 U/L (ref 105–235)

## 2024-04-18 LAB — PHOSPHORUS: Phosphorus: 3.8 mg/dL (ref 2.5–4.6)

## 2024-04-18 MED ORDER — HYDROCODONE-ACETAMINOPHEN 10-325 MG PO TABS: 1.0000 | ORAL_TABLET | Freq: Two times a day (BID) | ORAL | Status: AC | PRN

## 2024-04-18 MED ORDER — LACTATED RINGERS IV SOLN: INTRAVENOUS | Status: AC

## 2024-04-18 MED ORDER — ONDANSETRON HCL 4 MG/2ML IJ SOLN: 4.0000 mg | Freq: Four times a day (QID) | INTRAMUSCULAR | Status: AC | PRN

## 2024-04-18 MED ORDER — AMLODIPINE BESYLATE 5 MG PO TABS: 10.0000 mg | ORAL_TABLET | Freq: Every day | ORAL | Status: AC

## 2024-04-18 MED ORDER — LEVOTHYROXINE SODIUM 50 MCG PO TABS: 250.0000 ug | ORAL_TABLET | Freq: Every day | ORAL | Status: AC

## 2024-04-18 MED ORDER — PRAVASTATIN SODIUM 40 MG PO TABS: 40.0000 mg | ORAL_TABLET | Freq: Every day | ORAL | Status: DC

## 2024-04-18 MED ORDER — AMLODIPINE BESYLATE 5 MG PO TABS: 5.0000 mg | ORAL_TABLET | Freq: Every day | ORAL | Status: DC

## 2024-04-18 MED ORDER — SODIUM CHLORIDE 0.9% IV SOLUTION: Freq: Once | INTRAVENOUS | Status: AC

## 2024-04-18 MED ORDER — ONDANSETRON HCL 4 MG PO TABS: 4.0000 mg | ORAL_TABLET | Freq: Four times a day (QID) | ORAL | Status: AC | PRN

## 2024-04-18 MED ORDER — ROSUVASTATIN CALCIUM 10 MG PO TABS
10.0000 mg | ORAL_TABLET | Freq: Every day | ORAL | Status: AC
  Administered 2024-04-18: 10 mg via ORAL
  Filled 2024-04-18: qty 1

## 2024-04-18 MED ORDER — METOPROLOL TARTRATE 25 MG PO TABS
25.0000 mg | ORAL_TABLET | Freq: Two times a day (BID) | ORAL | Status: AC
  Filled 2024-04-18: qty 1

## 2024-04-18 MED ORDER — LACTATED RINGERS IV BOLUS
500.0000 mL | Freq: Once | INTRAVENOUS | Status: AC
  Administered 2024-04-18: 500 mL via INTRAVENOUS

## 2024-04-18 NOTE — ED Triage Notes (Addendum)
 Pt went to PCP for a check up yesterday. Today they called to tell pt to go to the ED and everything is out of wack. Pt states when he stands up he can't walk too good. A&Ox4  Ever since the fall on ice on Saturday he has been having issues moving around. Pt feels off balance when he stands up.

## 2024-04-18 NOTE — ED Provider Notes (Cosign Needed)
 " [place] EMERGENCY DEPARTMENT AT [facility] Provider Note   CSN: [id number] Arrival date & time: 04/18/24  1123     Patient presents with: No chief complaint on file.   Randy Carpenter is a 79 y.o. male.   Patient is an 79 year old male who presents to the emergency department secondary to worsening generalized weakness and abnormal labs.  He was evaluated by his primary care doctor yesterday and was noted to contact to the emergency department secondary to abnormal labs.  Patient was noted to have elevated creatinine.  Patient notes he continues to have ongoing back pain from a fall a few days ago.  He was evaluated in the emergency department following this with unremarkable imaging.  He denies any associated chest pain or shortness of breath.  He has had no abdominal pain, nausea, vomiting, diarrhea.  He has continued to urinate at his baseline.        Prior to Admission medications  Medication Sig Start Date End Date Taking? Authorizing Provider  amLODipine  (NORVASC ) 5 MG tablet Take 5 mg by mouth daily.    [provider]  aspirin  325 MG tablet Take 1 tablet (325 mg total) by mouth daily. 05/09/13   Antoinette Doe, MD  clopidogrel  (PLAVIX ) 75 MG tablet Take 1 tablet (75 mg total) by mouth daily with breakfast. Patient not taking: Reported on 06/19/2014 05/10/13   Antoinette Doe, MD  doxycycline  (VIBRAMYCIN ) 100 MG capsule Take 1 capsule (100 mg total) by mouth 2 (two) times daily. 08/02/16   Griselda Norris, MD  HYDROcodone -acetaminophen  [facility]) 10-325 MG tablet Take 1 tablet by mouth 2 (two) times daily as needed for moderate pain or severe pain.  09/24/15   [provider]  levothyroxine  (SYNTHROID , LEVOTHROID) 175 MCG tablet Take 175 mcg by mouth daily before breakfast.    [provider]  losartan -hydrochlorothiazide  (HYZAAR) 100-25 MG per tablet Take 1 tablet by mouth daily.    [provider]  metoprolol  (LOPRESSOR ) 50 MG tablet  Take 0.5 tablets (25 mg total) by mouth 2 (two) times daily. For high blood pressure Patient not taking: Reported on 08/01/2016 05/09/13   Antoinette Doe, MD  metoprolol  tartrate (LOPRESSOR ) 25 MG tablet Take 25 mg by mouth 2 (two) times daily.    [provider]  pravastatin  (PRAVACHOL ) 40 MG tablet Take 1 tablet (40 mg total) by mouth daily. 09/21/12   Delores Bernardino POUR, MD    Allergies: Patient has no known allergies.    Review of Systems  Neurological:  Positive for weakness.  All other systems reviewed and are negative.   Updated Vital Signs BP (!) 154/74 (BP Location: Right Arm)   Pulse (!) 42   Temp (!) 96.3 F (35.7 C) (Axillary)   Resp 18   SpO2 99%   Physical Exam Vitals and nursing note reviewed.  Constitutional:      General: He is not in acute distress.    Appearance: Normal appearance. He is not ill-appearing.  HENT:     Head: Normocephalic and atraumatic.     Nose: Nose normal.     Mouth/Throat:     Mouth: Mucous membranes are moist.  Eyes:     Extraocular Movements: Extraocular movements intact.     Conjunctiva/sclera: Conjunctivae normal.     Pupils: Pupils are equal, round, and reactive to light.  Cardiovascular:     Rate and Rhythm: Normal rate and regular rhythm.     Pulses: Normal pulses.  Heart sounds: Normal heart sounds. No murmur heard.    No gallop.  Pulmonary:     Effort: Pulmonary effort is normal. No respiratory distress.     Breath sounds: Normal breath sounds. No stridor. No wheezing, rhonchi or rales.  Abdominal:     General: Abdomen is flat. Bowel sounds are normal. There is no distension.     Palpations: Abdomen is soft.     Tenderness: There is no abdominal tenderness. There is no guarding.  Musculoskeletal:        General: Normal range of motion.     Cervical back: Normal range of motion and neck supple. No rigidity or tenderness.     Comments: Tender to palpation over thoracic and lumbar spine, no step-off or deformity, no  CVA tenderness  Skin:    General: Skin is warm and dry.     Findings: No bruising or rash.  Neurological:     General: No focal deficit present.     Mental Status: He is alert and oriented to person, place, and time. Mental status is at baseline.     Cranial Nerves: No cranial nerve deficit.     Sensory: No sensory deficit.     Motor: No weakness.     Coordination: Coordination normal.     Gait: Gait normal.  Psychiatric:        Mood and Affect: Mood normal.        Behavior: Behavior normal.        Thought Content: Thought content normal.        Judgment: Judgment normal.     (all labs ordered are listed, but only abnormal results are displayed) Labs Reviewed  URINALYSIS, ROUTINE W REFLEX MICROSCOPIC - Abnormal; Notable for the following components:      Result Value   Hgb urine dipstick SMALL (*)    Protein, ur 30 (*)    Bacteria, UA RARE (*)    All other components within normal limits  CBC WITH DIFFERENTIAL/PLATELET  COMPREHENSIVE METABOLIC PANEL WITH GFR  MAGNESIUM    EKG: None  Radiology: No results found.   Procedures   Medications Ordered in the ED  lactated ringers  bolus 500 mL (has no administration in time range)  lactated ringers  infusion (has no administration in time range)                                    Medical Decision Making Amount and/or Complexity of Data Reviewed Labs: ordered. Radiology: ordered.  Risk Prescription drug management. Decision regarding hospitalization.   This patient presents to the ED for concern of abnormal labs, weakness, fall, this involves an extensive number of treatment options, and is a complaint that carries with it a high risk of complications and morbidity.  The differential diagnosis includes electrolyte derangement, acute kidney injury, dehydration, arrhythmia, sepsis, pneumonia, malignancy, ITP, TTP, HUS   Co morbidities that complicate the patient evaluation  CAD, COPD, hyperlipidemia, hypertension,  thyroid disease, history of skin cancer   Additional history obtained:  Additional history obtained from medical records External records from outside source obtained and reviewed including medical records   Lab Tests:  I Ordered, and personally interpreted labs.  The pertinent results include: No leukocytosis, no anemia, thrombocytopenia noted, elevated creatinine, normal electrolytes, elevated alkaline phosphatase, unremarkable urinalysis   Imaging Studies ordered:  I ordered imaging studies including CT scan head, CT abdomen and pelvis I independently  visualized and interpreted imaging which showed no acute intracranial process, right renal mass, extensive adenopathy, infrarenal abdominal aortic aneurysm, cholelithiasis I agree with the radiologist interpretation   Cardiac Monitoring: / EKG:  The patient was maintained on a cardiac monitor.  I personally viewed and interpreted the cardiac monitored which showed an underlying rhythm of: Bradycardia, no ST/T wave changes, no ischemic changes, no STEMI   Consultations Obtained:  I requested consultation with the oncology, Dr. Davonna,  and discussed lab and imaging findings as well as pertinent plan - they recommend: Add on path smear review, LDH, haptoglobin, will see the patient in consult   Problem List / ED Course / Critical interventions / Medication management  Patient does remain stable at this time.  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  CT scan of the chest abdomen and pelvis is concerning for renal mass and renal carcinoma.  He does have associated lymphadenopathy as well.  Did discuss this with oncology who will see the patient in consult and did recommend biopsy.  Patient has acute kidney injury and is currently receiving IV fluids at this point.  He does not clinically appear to be fluid overloaded.  Have discussed patient case with Dr. Barbra  with the hospital service who has excepted for admission. I ordered medication including IV fluids for acute kidney injury Reevaluation of the patient after these medicines showed that the patient improved I have reviewed the patients home medicines and have made adjustments as needed   Social Determinants of Health:  None   Test / Admission - Considered:  Admission     Final diagnoses:  None    ED Discharge Orders     None          Daralene Lonni BIRCH, PA-C 04/18/24 1553  "

## 2024-04-18 NOTE — H&P (Signed)
 " History and Physical    Patient: Randy Carpenter DOB: 1946-01-31 DOA: 04/18/2024 DOS: the patient was seen and examined on 04/18/2024 PCP: Corrington, Coye LABOR, MD  Patient coming from: Home  Chief Complaint: No chief complaint on file.  HPI: Randy Carpenter is a 79 y.o. male with medical history significant of CAD with h/o NSTEMI, COPD, HTN, hypothyroidism. He experienced a fall on 1/29 after slipping on a patch of ice.  He fell backwards hitting his left hip, left posterior chest wall, left shoulder, and struck his head.  There was no loss of consciousness.  He came to the emergency department, had a head CT and imaging of his neck, which were all normal.  He was then discharged to home.  At that time, labs were not indicated.  He has experienced quite a bit of bruising and has continued to have pain extending from his hip to his left shoulder blade.  He saw his primary care provider, who performed some labs.  Today, his PCP called him and instructed him to come to the emergency department due to abnormal findings.  The patient has continued to express weakness that has been generalized.  Labs were repeated here showing a creatinine of 3.3, which is above his baseline of 1.6.  In addition, his platelets were 29.  Denies spontaneous bleeding, dizziness, lightheadedness.  Yesterday, his thyroid panel was 35.7.  Free T4 pending.  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 He states that he has been compliant with his thyroid medication.  Review of Systems: As mentioned in the history of present illness. All other systems reviewed and are negative. Past Medical History:  Diagnosis Date   CAD (coronary artery disease), autologous vein bypass graft    Cardiac Cath [id number].No aortic stenosis on pullback,LIMA large vessel with angiography normal. Abdominal aorta with moderate plaquing without significant stenosis. No  abdominal aortic aneurysm.Left main distal 40% stenosis LAD the vessel is diffusely diseased giving rise to a single large diagonal. The proximal LAD has up to 90% stenosis. There is a 70% stenosis in the mid vessel after a large sept    COPD (chronic obstructive pulmonary disease) (HCC)    Hypercholesterolemia    Hypertension    Obesity    Thyroid disease    Tobacco abuse    Past Surgical History:  Procedure Laterality Date   CORONARY ARTERY BYPASS GRAFT     Social History:  reports that he has been smoking cigarettes. He has a 14.8 pack-year smoking history. He does not have any smokeless tobacco history on file. He reports that he does not drink alcohol and does not use drugs.  Allergies[1]  Family History  Problem Relation Age of Onset   Pancreatic cancer Mother     Prior to Admission medications  Medication Sig Start Date End Date Taking? Authorizing Provider  amLODipine  (NORVASC ) 5 MG tablet Take 5 mg by mouth daily.    [provider]  aspirin  325 MG tablet Take 1 tablet (325 mg total) by mouth daily. 05/09/13   Antoinette Doe, MD  clopidogrel  (PLAVIX ) 75 MG tablet Take 1 tablet (75 mg total) by mouth daily with breakfast. Patient not taking: Reported on 06/19/2014 05/10/13   Antoinette Doe, MD  doxycycline  (VIBRAMYCIN ) 100 MG capsule Take 1 capsule (100 mg total) by mouth 2 (two) times daily. 08/02/16   Griselda Norris, MD  HYDROcodone -acetaminophen  [facility]) [id number]  MG tablet Take 1 tablet by mouth 2 (two) times daily as needed for moderate pain or severe pain.  09/24/15   [provider]  levothyroxine  (SYNTHROID , LEVOTHROID) 175 MCG tablet Take 175 mcg by mouth daily before breakfast.    [provider]  losartan -hydrochlorothiazide  (HYZAAR) 100-25 MG per tablet Take 1 tablet by mouth daily.    [provider]  metoprolol  (LOPRESSOR ) 50 MG tablet Take 0.5 tablets (25 mg total) by mouth 2 (two) times daily. For high blood pressure Patient not  taking: Reported on 08/01/2016 05/09/13   Antoinette Doe, MD  metoprolol  tartrate (LOPRESSOR ) 25 MG tablet Take 25 mg by mouth 2 (two) times daily.    [provider]  pravastatin  (PRAVACHOL ) 40 MG tablet Take 1 tablet (40 mg total) by mouth daily. 09/21/12   Delores Bernardino POUR, MD    Physical Exam: Vitals:   04/18/24 1204  BP: (!) 154/74  Pulse: (!) 42  Resp: 18  Temp: (!) 96.3 F (35.7 C)  TempSrc: Axillary  SpO2: 99%   General: Elderly male. Awake and alert and oriented x3. No acute cardiopulmonary distress.  HEENT: Normocephalic atraumatic.  Right and left ears normal in appearance.  Pupils equal, round, reactive to light. Extraocular muscles are intact. Sclerae anicteric and noninjected.  Moist mucosal membranes. No mucosal lesions.  Neck: Neck supple without lymphadenopathy. No carotid bruits. No masses palpated.  Cardiovascular: Regular rate with normal S1-S2 sounds. No murmurs, rubs, gallops auscultated. No JVD.  Respiratory: Good respiratory effort with no wheezes, rales, rhonchi. Lungs clear to auscultation bilaterally.  No accessory muscle use. Abdomen: Soft, nontender, nondistended. Active bowel sounds. No masses or hepatosplenomegaly  Skin: No rashes, lesions, or ulcerations.  Dry, warm to touch. 2+ dorsalis pedis and radial pulses. Musculoskeletal: No calf or leg pain. All major joints not erythematous nontender.  No upper or lower joint deformation.  Good ROM.  No contractures  Psychiatric: Intact judgment and insight. Pleasant and cooperative. Neurologic: No focal neurological deficits. Strength is 5/5 and symmetric in upper and lower extremities.  Cranial nerves II through XII are grossly intact.  Data Reviewed: Labs and imaging reviewed by me  Assessment and Plan: No notes have been filed under this hospital service. Service: Hospitalist  Principal Problem:   AKI (acute kidney injury) Active Problems:   TIA (transient ischemic attack)   HTN (hypertension)    CAD (coronary artery disease)   Right kidney mass   Thrombocytopenia   Hypothyroidism  AKI Gentle IV fluids Repeat creatinine in the morning Right kidney mass Hematology to see Thrombocytopenia, Uncertain of etiology Could be secondary to uncontrolled hypothyroidism Start treatment for hypothyroidism Check HIV, LDH, haptoglobin, peripheral smear Platelets on hold.  Does not need transfusion is not currently having bleeding.  If has spontaneous bleeding, then will transfuse Hypothyroidism Levothyroxine  250mcg daily Coronary artery disease Hold aspirin  Hypertension Continue antihypertensives TIA   Advance Care Planning:   Code Status: Limited: Do not attempt resuscitation (DNR) -DNR-LIMITED -Do Not Intubate/DNI confirmed by patient  Consults: Hematology/oncology  Family Communication: None  Severity of Illness: The appropriate patient status for this patient is INPATIENT. Inpatient status is judged to be reasonable and necessary in order to provide the required intensity of service to ensure the patient's safety. The patient's presenting symptoms, physical exam findings, and initial radiographic and laboratory data in the context of their chronic comorbidities is felt to place them at high risk for further clinical deterioration. Furthermore, it is not anticipated that the patient  will be medically stable for discharge from the hospital within 2 midnights of admission.   * I certify that at the point of admission it is my clinical judgment that the patient will require inpatient hospital care spanning beyond 2 midnights from the point of admission due to high intensity of service, high risk for further deterioration and high frequency of surveillance required.*  Author: Haidyn Chadderdon J Aleeyah Bensen, DO 04/18/2024 4:08 PM  For on call review [email or web address].      [1] No Known Allergies  "
